# Patient Record
Sex: Female | Born: 1965 | Race: White | Hispanic: No | Marital: Married | Smoking: Current some day smoker
Health system: Southern US, Community
[De-identification: ages and names within clinical notes are randomized; demographics above are authoritative.]

## PROBLEM LIST (undated history)

## (undated) DIAGNOSIS — K603 Anal fistula: Secondary | ICD-10-CM

## (undated) DIAGNOSIS — T7840XA Allergy, unspecified, initial encounter: Secondary | ICD-10-CM

## (undated) DIAGNOSIS — K5090 Crohn's disease, unspecified, without complications: Secondary | ICD-10-CM

## (undated) DIAGNOSIS — K219 Gastro-esophageal reflux disease without esophagitis: Secondary | ICD-10-CM

## (undated) DIAGNOSIS — I471 Supraventricular tachycardia, unspecified: Secondary | ICD-10-CM

## (undated) DIAGNOSIS — F331 Major depressive disorder, recurrent, moderate: Secondary | ICD-10-CM

## (undated) DIAGNOSIS — R079 Chest pain, unspecified: Secondary | ICD-10-CM

## (undated) DIAGNOSIS — G4733 Obstructive sleep apnea (adult) (pediatric): Secondary | ICD-10-CM

## (undated) HISTORY — DX: Obstructive sleep apnea (adult) (pediatric): G47.33

## (undated) HISTORY — DX: Chest pain, unspecified: R07.9

## (undated) HISTORY — DX: Supraventricular tachycardia: I47.1

## (undated) HISTORY — PX: OTHER SURGICAL HISTORY: SHX169

## (undated) HISTORY — DX: Gastro-esophageal reflux disease without esophagitis: K21.9

## (undated) HISTORY — DX: Major depressive disorder, recurrent, moderate: F33.1

## (undated) HISTORY — DX: Supraventricular tachycardia, unspecified: I47.10

## (undated) HISTORY — DX: Crohn's disease, unspecified, without complications: K50.90

## (undated) HISTORY — DX: Allergy, unspecified, initial encounter: T78.40XA

## (undated) HISTORY — PX: ABCESS DRAINAGE: SHX399

## (undated) HISTORY — DX: Anal fistula: K60.3

---

## 2007-02-20 LAB — HM COLONOSCOPY

## 2008-07-12 ENCOUNTER — Inpatient Hospital Stay (HOSPITAL_COMMUNITY): Admission: AD | Admit: 2008-07-12 | Discharge: 2008-07-12 | Payer: Self-pay | Admitting: Obstetrics & Gynecology

## 2010-07-31 ENCOUNTER — Other Ambulatory Visit (HOSPITAL_COMMUNITY): Payer: Self-pay | Admitting: Family Medicine

## 2010-07-31 DIAGNOSIS — Z1231 Encounter for screening mammogram for malignant neoplasm of breast: Secondary | ICD-10-CM

## 2010-08-08 ENCOUNTER — Ambulatory Visit (HOSPITAL_COMMUNITY)
Admission: RE | Admit: 2010-08-08 | Discharge: 2010-08-08 | Disposition: A | Discharge status: Home / Self Care | Payer: BC Managed Care – PPO | Source: Ambulatory Visit | Attending: Family Medicine | Admitting: Family Medicine

## 2010-08-08 DIAGNOSIS — Z1231 Encounter for screening mammogram for malignant neoplasm of breast: Secondary | ICD-10-CM

## 2011-05-01 ENCOUNTER — Ambulatory Visit: Payer: Self-pay

## 2011-05-01 ENCOUNTER — Ambulatory Visit (INDEPENDENT_AMBULATORY_CARE_PROVIDER_SITE_OTHER): Payer: BC Managed Care – PPO | Admitting: Family Medicine

## 2011-05-01 ENCOUNTER — Ambulatory Visit
Admission: RE | Admit: 2011-05-01 | Discharge: 2011-05-01 | Disposition: A | Discharge status: Home / Self Care | Payer: Self-pay | Source: Ambulatory Visit | Attending: Family Medicine | Admitting: Family Medicine

## 2011-05-01 VITALS — BP 122/78 | HR 81 | Temp 99.4°F | Resp 18 | Ht 64.0 in | Wt 171.0 lb

## 2011-05-01 DIAGNOSIS — K219 Gastro-esophageal reflux disease without esophagitis: Secondary | ICD-10-CM

## 2011-05-01 DIAGNOSIS — K5090 Crohn's disease, unspecified, without complications: Secondary | ICD-10-CM | POA: Insufficient documentation

## 2011-05-01 DIAGNOSIS — R059 Cough, unspecified: Secondary | ICD-10-CM

## 2011-05-01 DIAGNOSIS — R05 Cough: Secondary | ICD-10-CM

## 2011-05-01 DIAGNOSIS — R079 Chest pain, unspecified: Secondary | ICD-10-CM

## 2011-05-01 DIAGNOSIS — Z3040 Encounter for surveillance of contraceptives, unspecified: Secondary | ICD-10-CM

## 2011-05-01 HISTORY — DX: Gastro-esophageal reflux disease without esophagitis: K21.9

## 2011-05-01 LAB — POCT CBC
Granulocyte percent: 64.8 %G (ref 37–80)
HCT, POC: 42.6 % (ref 37.7–47.9)
Hemoglobin: 13.9 g/dL (ref 12.2–16.2)
Lymph, poc: 2.3 (ref 0.6–3.4)
MCH, POC: 32.2 pg — AB (ref 27–31.2)
MCHC: 32.6 g/dL (ref 31.8–35.4)
MCV: 98.6 fL — AB (ref 80–97)
MID (cbc): 0.6 (ref 0–0.9)
MPV: 8.1 fL (ref 0–99.8)
POC Granulocyte: 5.4 (ref 2–6.9)
POC LYMPH PERCENT: 28.1 %L (ref 10–50)
POC MID %: 7.1 %M (ref 0–12)
Platelet Count, POC: 292 10*3/uL (ref 142–424)
RBC: 4.32 M/uL (ref 4.04–5.48)
RDW, POC: 12.9 %
WBC: 8.3 10*3/uL (ref 4.6–10.2)

## 2011-05-01 LAB — BASIC METABOLIC PANEL
BUN: 10 mg/dL (ref 6–23)
CO2: 25 mEq/L (ref 19–32)
Calcium: 9.7 mg/dL (ref 8.4–10.5)
Chloride: 98 mEq/L (ref 96–112)
Creat: 0.67 mg/dL (ref 0.50–1.10)
Glucose, Bld: 87 mg/dL (ref 70–99)
Potassium: 4.5 mEq/L (ref 3.5–5.3)
Sodium: 131 mEq/L — ABNORMAL LOW (ref 135–145)

## 2011-05-01 LAB — POCT SEDIMENTATION RATE: POCT SED RATE: 8 mm/hr (ref 0–22)

## 2011-05-01 MED ORDER — METHYLPREDNISOLONE 4 MG PO TABS: 4.0000 mg | ORAL_TABLET | Freq: Every day | ORAL | Status: AC

## 2011-05-01 MED ORDER — IOHEXOL 350 MG/ML SOLN
100.0000 mL | Freq: Once | INTRAVENOUS | Status: AC | PRN
  Administered 2011-05-01: 100 mL via INTRAVENOUS

## 2011-05-01 MED ORDER — OXYCODONE-ACETAMINOPHEN 5-325 MG PO TABS: 1.0000 | ORAL_TABLET | Freq: Three times a day (TID) | ORAL | Status: AC | PRN

## 2011-05-01 NOTE — Patient Instructions (Signed)
Pulmonary Embolus A pulmonary (lung) embolus (PE) is a blood clot that has traveled from another place in the body to the lung. Most clots come from deep veins in the legs or pelvis. PE is a dangerous and potentially life-threatening condition that can be treated if identified. CAUSES Blood clots form in a vein for different reasons. Usually several things cause blood clots. They include:  The flow of blood slows down.   The inside of the vein is damaged in some way.   The person has a condition that makes the blood clot more easily. These conditions may include:   Older age (especially over 41 years old).   Having a history of blood clots.   Having major or lengthy surgery. Hip surgery is particularly high-risk.   Breaking a hip or leg.   Sitting or lying still for a long time.   Cancer or cancer treatment.   Having a long, thin tube (catheter) placed inside a vein during a medical procedure.   Being overweight (obese).   Pregnancy and childbirth.   Medicines with estrogen.   Smoking.   Other circulation or heart problems.  SYMPTOMS  The symptoms of a PE usually start suddenly and include:  Shortness of breath.   Coughing.   Coughing up blood or blood-tinged mucus (phlegm).   Chest pain. Pain is often worse with deep breaths.   Rapid heartbeat.  DIAGNOSIS  If a PE is suspected, your caregiver will take a medical history and carry out a physical exam. Your caregiver will check for the risk factors listed above. Tests that also may be required include:  Blood tests, including studies of the clotting properties of your blood.   Imaging tests. Ultrasound, CT, MRI, and other tests can all be used to see if you have clots in your legs or lungs. If you have a clot in your legs and have breathing or chest problems, your caregiver may conclude that you have a clot in your lungs. Further lung tests may not be needed.   An EKG can look for heart strain from blood clots in  the lungs.  PREVENTION   Exercise the legs regularly. Take a brisk 30 minute walk every day.   Maintain a weight that is appropriate for your height.   Avoid sitting or lying in bed for long periods of time without moving your legs.   Women, particularly those over the age of 65, should consider the risks and benefits of taking estrogen medicines, including birth control pills.   Do not smoke, especially if you take estrogen medicines.   Long-distance travel can increase your risk. You should exercise your legs by walking or pumping the muscles every hour.   In hospital prevention:   Your caregiver will assess your need for preventive PE care (prophylaxis) when you are admitted to the hospital. If you are having surgery, your surgeon will assess you the day of or day after surgery.   Prevention may include medical and nonmedical measures.  TREATMENT   The most common treatment for a PE is blood thinning (anticoagulant) medicine, which reduces the blood's tendency to clot. Anticoagulants can stop new blood clots from forming and old ones from growing. They cannot dissolve existing clots. Your body does this by itself over time. Anticoagulants can be given by mouth, by intravenous (IV) access, or by injection. Your caregiver will determine the best program for you.   Less commonly, clot-dissolving drugs (thrombolytics) are used to dissolve a PE. They  carry a high risk of bleeding, so they are used mainly in severe cases.   Very rarely, a blood clot in the leg needs to be removed surgically.   If you are unable to take anticoagulants, your caregiver may arrange for you to have a filter placed in a main vein in your belly (abdomen). This filter prevents clots from traveling to your lungs.  HOME CARE INSTRUCTIONS   Take all medicines prescribed by your caregiver. Follow the directions carefully.   You will most likely continue taking anticoagulants after you leave the hospital. Your  caregiver will advise you on the length of treatment (usually 3 to 6 months, sometimes for life).   Taking too much or too little of an anticoagulant is dangerous. While taking this type of medicine, you will need to have regular blood tests to be sure the dose is correct. The dose can change for many reasons. It is critically important that you take this medicine exactly as prescribed and that you have blood tests exactly as directed.   Many foods can interfere with anticoagulants. These include foods high in vitamin K, such as spinach, kale, broccoli, cabbage, collard and turnip greens, Brussels sprouts, peas, cauliflower, seaweed, parsley, beef and pork liver, green tea, and soybean oil. Your caregiver should discuss limits on these foods with you or you should arrange a visit with a dietician to answer your questions.   Many medicines can interfere with anticoagulants. You must tell your caregiver about any and all medicines you take. This includesall vitamins and supplements. Be especially cautious with aspirin and anti-inflammatory medicines. Ask your caregiver before taking these.   Anticoagulants can have side effects, mostly excessive bruising or bleeding. You will need to hold pressure over cuts for longer than usual. Avoid alcoholic drinks or consume only very small amounts while taking this medicine.   If you are taking an anticoagulant:   Wear a medical alert bracelet.   Notify your dentist or other caregivers before procedures.   Avoid contact sports.   Ask your caregiver how soon you can go back to normal activities. Not being active can lead to new clots. Ask for a list of what you should and should not do.   Exercise your lower leg muscles. This is important while traveling.   You may need to wear compression stockings. These are tight elastic stockings that apply pressure to the lower legs. This can help keep the blood in the legs from clotting.   If you are a smoker, you  should quit.   Learn as much as you can about pulmonary embolisms.  SEEK MEDICAL CARE IF:   You notice a rapid heartbeat.   You feel weaker or more tired than usual.   You feel faint.   You notice increased bruising.   Your symptoms are not getting better in the time expected.   You are having side effects of medicine.   You have an oral temperature above 102 F (38.9 C).   You discover other family members with blood clots. This may require further testing for inherited diseases or conditions.  SEEK IMMEDIATE MEDICAL CARE IF:   You have chest pain.   You have trouble breathing.   You have new or increased swelling or pain in one leg.   You cough up blood.   You notice blood in vomit, in a bowel movement, or in urine.   You have an oral temperature above 102 F (38.9 C), not controlled by medicine.  You may have another PE. A blood clot in the lungs is a medical emergency. Call your local emergency services (911 in [place] to get to the nearest hospital or clinic. Do not drive yourself. MAKE SURE YOU:   Understand these instructions.   Will watch your condition.   Will get help right away if you are not doing well or get worse.  Document Released: 02/03/2000 Document Revised: 01/25/2011 Document Reviewed: 08/09/2008 [employer] Patient Information 2012 [place], [place].

## 2011-05-01 NOTE — Progress Notes (Signed)
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  The onset was gradual and originally thought by patient to be costochondritis and subsequently patient was treated with pneumonia with Z pak, improved, but symptoms have come back.  Patient has had right calf pain for years off and on but none recently  O: NAD, patient appears comfortable at rest with normal color Chest: nontender, clear to auscultation.  Records from Dr. Modena Morrow reviewed.  A:  Atypical chest and back pain with high risk meds  P:  Sed rate, cbc, cmet CT chest   CT report at 4:30pm:  Negative for PE, patient does have gallstones with collapsed gall bladder, no dilatation.

## 2011-05-01 NOTE — Progress Notes (Signed)
Addended by: Robyn Haber on: 05/01/2011 05:07 PM   Modules accepted: Orders

## 2011-06-04 ENCOUNTER — Encounter: Payer: Self-pay | Admitting: *Deleted

## 2011-06-05 ENCOUNTER — Encounter: Payer: Self-pay | Admitting: Pulmonary Disease

## 2011-06-05 ENCOUNTER — Ambulatory Visit (INDEPENDENT_AMBULATORY_CARE_PROVIDER_SITE_OTHER): Payer: Self-pay | Admitting: Pulmonary Disease

## 2011-06-05 DIAGNOSIS — R079 Chest pain, unspecified: Secondary | ICD-10-CM

## 2011-06-05 HISTORY — DX: Chest pain, unspecified: R07.9

## 2011-06-05 NOTE — Progress Notes (Signed)
  Subjective:    Patient ID: Autumn Robinson, female    DOB: Nov 09, 1965, 46 y.o.   MRN: [id number]  HPI The patient is a 46 year old female who I have been asked to see for atypical chest pain.  She has a history of costochondritis in the past, but approximately 7 weeks ago began to have atypical chest pain that started after developing upper respiratory infection type symptoms.  She describes the pain across the top of her back, and on occasion in the substernal area.   She did not have any discomfort laterally.  The pain was worse with breathing, but was not aggravated by movement.  She described as a sharp ache, but really did not describe classic pleuritic pain.  The patient had a chest x-ray which apparently was suggestive of right middle lobe pneumonia, and was treated with azithromycin.  The patient felt that her pain resolved 100%, but states that she was also on meloxicam at the time.  5 days after taking the antibiotic, her discomfort returned, and it was just as severe.  She underwent a CT of her chest in March, and this showed no pulmonary embolus, no pleural or parenchymal disease.  The patient also had an EKG in March that showed no evidence for pericarditis.  She was treated with a course of prednisone for 10 days and had significant improvement in her symptoms.  Her pain has really not returned except for an occasional mild substernal discomfort.  The patient has no history of autoimmune disease, and is on Remicade for Crohn's disease.  She has also had a PPD recently that was unremarkable.    Review of Systems  Constitutional: Negative.  Negative for fever and unexpected weight change.  HENT: Negative.  Negative for ear pain, nosebleeds, congestion, sore throat, rhinorrhea, sneezing, trouble swallowing, dental problem, postnasal drip and sinus pressure.   Eyes: Negative.  Negative for redness and itching.  Respiratory: Positive for cough and shortness of breath. Negative for chest tightness  and wheezing.   Cardiovascular: Positive for chest pain. Negative for palpitations and leg swelling.  Gastrointestinal: Negative.  Negative for nausea and vomiting.  Genitourinary: Negative.  Negative for dysuria.  Musculoskeletal: Negative.  Negative for joint swelling.  Skin: Negative.  Negative for rash.  Neurological: Negative.  Negative for headaches.  Hematological: Negative.  Does not bruise/bleed easily.  Psychiatric/Behavioral: Negative.  Negative for dysphoric mood. The patient is not nervous/anxious.        Objective:   Physical Exam Constitutional:  Well developed, no acute distress  HENT:  Nares patent without discharge  Oropharynx without exudate, palate and uvula are normal  Eyes:  Perrla, eomi, no scleral icterus  Neck:  No JVD, no TMG  Cardiovascular:  Normal rate, regular rhythm, no rubs or gallops.  No murmurs        Intact distal pulses  Pulmonary :  Normal breath sounds, no stridor or respiratory distress   No rales, rhonchi, or wheezing  Abdominal:  Soft, nondistended, bowel sounds present.  No tenderness noted.   Musculoskeletal:  No lower extremity edema noted.  Lymph Nodes:  No cervical lymphadenopathy noted  Skin:  No cyanosis noted  Neurologic:  Alert, appropriate, moves all 4 extremities without obvious deficit.         Assessment & Plan:

## 2011-06-05 NOTE — Patient Instructions (Signed)
No further testing or treatment suggested unless you have a recurrence of your symptoms.  At that point, would check bloodwork for possible superimposed autoimmune disease, though I suspect is unlikely.

## 2011-06-05 NOTE — Assessment & Plan Note (Signed)
The patient has had sharp atypical chest pain that may or may not be pleuritic in nature.  The question was raised whether she may have had pneumonia, and seemed to improve with antibiotics.  She also had significant improvement with a course of prednisone after her pain recurred.  She currently has minimal discomfort, and has had a CT chest that is totally unremarkable.  It is unclear whether this may have been some type of viral process from the very beginning.  At this point, I would do nothing different, and hope that her pain resolves and stays a non-issue.  If she has significant recurrence, would consider checking bloodwork for an autoimmune process.

## 2011-07-24 ENCOUNTER — Encounter: Payer: Self-pay | Admitting: Family Medicine

## 2012-03-08 ENCOUNTER — Ambulatory Visit: Payer: BC Managed Care – PPO

## 2012-06-19 LAB — HM MAMMOGRAPHY: HM Mammogram: NORMAL (ref 0–4)

## 2012-06-23 ENCOUNTER — Other Ambulatory Visit (HOSPITAL_COMMUNITY): Payer: Self-pay | Admitting: Family Medicine

## 2012-06-23 DIAGNOSIS — Z1231 Encounter for screening mammogram for malignant neoplasm of breast: Secondary | ICD-10-CM

## 2012-06-26 ENCOUNTER — Ambulatory Visit (HOSPITAL_COMMUNITY)
Admission: RE | Admit: 2012-06-26 | Discharge: 2012-06-26 | Disposition: A | Discharge status: Home / Self Care | Payer: BC Managed Care – PPO | Source: Ambulatory Visit | Attending: Family Medicine | Admitting: Family Medicine

## 2012-06-26 DIAGNOSIS — Z1231 Encounter for screening mammogram for malignant neoplasm of breast: Secondary | ICD-10-CM | POA: Insufficient documentation

## 2012-07-22 ENCOUNTER — Ambulatory Visit (INDEPENDENT_AMBULATORY_CARE_PROVIDER_SITE_OTHER): Payer: BC Managed Care – PPO | Admitting: Internal Medicine

## 2012-07-22 ENCOUNTER — Ambulatory Visit: Payer: BC Managed Care – PPO

## 2012-07-22 VITALS — BP 118/72 | HR 84 | Temp 98.4°F | Resp 16 | Ht 62.5 in | Wt 144.0 lb

## 2012-07-22 DIAGNOSIS — M545 Low back pain, unspecified: Secondary | ICD-10-CM

## 2012-07-22 DIAGNOSIS — K50919 Crohn's disease, unspecified, with unspecified complications: Secondary | ICD-10-CM

## 2012-07-22 DIAGNOSIS — M546 Pain in thoracic spine: Secondary | ICD-10-CM

## 2012-07-22 DIAGNOSIS — K5090 Crohn's disease, unspecified, without complications: Secondary | ICD-10-CM

## 2012-07-22 MED ORDER — METHOCARBAMOL 750 MG PO TABS: 750.0000 mg | ORAL_TABLET | Freq: Four times a day (QID) | ORAL | Status: DC

## 2012-07-22 MED ORDER — IBUPROFEN 600 MG PO TABS: 600.0000 mg | ORAL_TABLET | Freq: Three times a day (TID) | ORAL | Status: DC | PRN

## 2012-07-22 NOTE — Progress Notes (Signed)
  Subjective:    Patient ID: Autumn Robinson, female    DOB: [birth year], 47 y.o.   MRN: [id number]  HPI Pt complains of mid to lower back pain which she states started at work. It has lasted a couple weeks, but states it overall has been bothering her for a couple of years. Hurts to twist, or really is painful in most positions. Never had xrays of her back. She does have crohns disease. She is on Remicaid and it is under control. She denies any urinary symptoms. She denies any numbness or tingling in the legs or toes. No trouble controlling her urine. Pain is thoracolumbar with no radiation down. Sitting long periods and pushing heavy cart cause the pain . Flexion is full but feels like unstable in back.Marland Kitchen  Hx of pleurisy in past . Has decreased immunity on remacaid. Smoker 25 yrs.   She had normal lab work, normal crp 2 mos ago. No anorexia, weight loss, fatigue. Crohns is not active.  Review of Systems     Objective:   Physical Exam  Vitals reviewed. Constitutional: She is oriented to person, place, and time. She appears well-developed and well-nourished.  Eyes: EOM are normal. No scleral icterus.  Neck: Normal range of motion. Neck supple.  Cardiovascular: Normal rate, regular rhythm and normal heart sounds.   Pulmonary/Chest: Effort normal.  Musculoskeletal: Normal range of motion. She exhibits tenderness.       Thoracic back: She exhibits tenderness, pain and spasm. She exhibits normal range of motion, no bony tenderness, no swelling, no edema and no deformity.       Lumbar back: She exhibits tenderness and pain. She exhibits normal range of motion, no bony tenderness, no swelling, no edema and no deformity.  Neurological: She is alert and oriented to person, place, and time. She exhibits normal muscle tone. Coordination normal.  Skin: Skin is warm and dry. No rash noted.  Psychiatric: She has a normal mood and affect. Her behavior is normal.      [facility] reading (PRIMARY) by  Dr Elder Cyphers  normal T and L spine      Assessment & Plan:  Quit smoking Chronic thoraco lumbar pain Strengthen/Stretch/Rehab Robaxin/Motrin

## 2012-07-22 NOTE — Progress Notes (Signed)
  Subjective:    Patient ID: Autumn Robinson, female    DOB: 12-12-1965, 47 y.o.   MRN: [id number]  HPI    Review of Systems     Objective:   Physical Exam       Assessment & Plan:

## 2012-07-22 NOTE — Patient Instructions (Signed)
Mid-Back Strain with Rehab  A strain is an injury in which a tendon or muscle is torn. The muscles and tendons of the mid-back are vulnverable to strains. However, these muscles and tendons are very strong and require a great force to be injured. The muscles of the mid-back are responsible for stabilizing the spinal column, as well as spinal twisting (rotation). Strains are classified into three categories. Grade 1 strains cause pain, but the tendon is not lengthened. Grade 2 strains include a lengthened ligament, due to the ligament being stretched or partially ruptured. With grade 2 strains there is still function, although the function may be decreased. Grade 3 strains involve a complete tear of the tendon or muscle, and function is usually impaired. SYMPTOMS   Pain in the middle of the back.  Pain that may affect only one side, and is worse with movement.  Muscle spasms, and often swelling in the back.  Loss of strength of the back muscles.  Crackling sound (crepitation) when the muscles are touched. CAUSES  Mid-back strains occur when a force is placed on the muscles or tendons that is greater than they can handle. Common causes of injury include:  Ongoing overuse of the muscle-tendon units in the middle back, usually from incorrect body posture.  A single violent injury or force applied to the back. RISK INCREASES WITH:  Sports that involve twisting forces on the spine or a lot of bending at the waist (football, rugby, weightlifting, bowling, golf, tennis, speed skating, racquetball, swimming, running, gymnastics, diving).  Poor strength and flexibility.  Failure to warm up properly before activity.  Family history of low back pain or disk disorders.  Previous back injury or surgery (especially fusion). PREVENTION  Learn and use proper sports technique.  Warm up and stretch properly before activity.  Allow for adequate recovery between workouts.  Maintain physical  fitness:  Strength, flexibility, and endurance.  Cardiovascular fitness. PROGNOSIS  If treated properly, mid-back strains usually heal within 6 weeks. RELATED COMPLICATIONS   Frequently recurring symptoms, resulting in a chronic problem. Properly treating the problem the first time decreases frequency of recurrence.  Chronic inflammation, scarring, and partial muscle-tendon tear.  Delayed healing or resolution of symptoms, especially if activity is resumed too soon.  Prolonged disability. TREATMENT Treatment first involves the use of ice and medicine, to reduce pain and inflammation. As the pain begins to subside, you may begin strengthening and stretching exercises to improve body posture and sport technique. These exercises may be performed at home or with a therapist. Severe injuries may require referral to a therapist for further evaluation and treatment, such as ultrasound. Corticosteroid injections may be given to help reduce inflammation. Biofeedback (watching monitors of your body processes) and psychotherapy may also be prescribed. Prolonged bed rest is felt to do more harm than good. Massage may help break the muscle spasms. Sometimes, an injection of cortisone, with or without local anesthetics, may be given to help relieve the pain and spasms. MEDICATION   If pain medicine is needed, nonsteroidal anti-inflammatory medicines (aspirin and ibuprofen), or other minor pain relievers (acetaminophen), are often advised.  Do not take pain medicine for 7 days before surgery.  Prescription pain relievers may be given, if your caregiver thinks they are needed. Use only as directed and only as much as you need.  Ointments applied to the skin may be helpful.  Corticosteroid injections may be given by your caregiver. These injections should be reserved for the most serious cases, because  they may only be given a certain number of times. HEAT AND COLD:   Cold treatment (icing) should be  applied for 10 to 15 minutes every 2 to 3 hours for inflammation and pain, and immediately after activity that aggravates your symptoms. Use ice packs or an ice massage.  Heat treatment may be used before performing stretching and strengthening activities prescribed by your caregiver, physical therapist, or athletic trainer. Use a heat pack or a warm water soak. SEEK IMMEDIATE MEDICAL CARE IF:  Symptoms get worse or do not improve in 2 to 4 weeks, despite treatment.  You develop numbness, weakness, or loss of bowel or bladder function.  New, unexplained symptoms develop. (Drugs used in treatment may produce side effects.) EXERCISES RANGE OF MOTION (ROM) AND STRETCHING EXERCISES - Mid-Back Strain These exercises may help you when beginning to rehabilitate your injury. In order to successfully resolve your symptoms, you must improve your posture. These exercises are designed to help reduce the forward-head and rounded-shoulder posture which contributes to this condition. Your symptoms may resolve with or without further involvement from your physician, physical therapist or athletic trainer. While completing these exercises, remember:   Restoring tissue flexibility helps normal motion to return to the joints. This allows healthier, less painful movement and activity.  An effective stretch should be held for at least 30 seconds.  A stretch should never be painful. You should only feel a gentle lengthening or release in the stretched tissue. STRETECH - Axial Extension  Stand or sit on a firm surface. Assume a good posture: chest up, shoulders drawn back, stomach muscles slightly tense, knees unlocked (if standing) and feet hip width apart.  Slowly retract your chin, so your head slides back and your chin slightly lowers. Continue to look straight ahead.  You should feel a gentle stretch in the back of your head. Be certain not to feel an aggressive stretch since this can cause headaches  later.  Hold for __________ seconds. Repeat __________ times. Complete this exercise __________ times per day. RANGE OF MOTION- Upper Thoracic Extension  Sit on a firm chair with a high back. Assume a good posture: chest up, shoulders drawn back, abdominal muscles slightly tense, and feet hip width apart. Place a small pillow or folded towel in the curve of your lower back, if you are having difficulty maintaining good posture.  Gently brace your neck with your hands, allowing your arms to rest on your chest.  Continue to support your neck and slowly extend your back over the chair. You will feel a stretch across your upper back.  Hold __________ seconds. Slowly return to the starting position. Repeat __________ times. Complete this exercise __________ times per day. RANGE OF MOTION- Mid-Thoracic Extension  Roll a towel so that it is about 4 inches in diameter.  Position the towel lengthwise. Lay on the towel so that your spine, but not your shoulder blades, are supported.  You should feel your mid-back arching toward the floor. To increase the stretch, extend your arms away from your body.  Hold for __________ seconds. Repeat exercise __________ times, __________ times per day. STRENGTHENING EXERCISES - Mid-Back Strain These exercises may help you when beginning to rehabilitate your injury. They may resolve your symptoms with or without further involvement from your physician, physical therapist or athletic trainer. While completing these exercises, remember:   Muscles can gain both the endurance and the strength needed for everyday activities through controlled exercises.  Complete these exercises as instructed by  your physician, physical therapist or athletic trainer. Increase the resistance and repetitions only as guided by your caregiver.  You may experience muscle soreness or fatigue, but the pain or discomfort you are trying to eliminate should never worsen during these  exercises. If this pain does worsen, stop and make certain you are following the directions exactly. If the pain is still present after adjustments, discontinue the exercise until you can discuss the trouble with your caregiver. STRENGTHENING Quadruped, Opposite UE/LE Lift  Assume a hands and knees position on a firm surface. Keep your hands under your shoulders and your knees under your hips. You may place padding under your knees for comfort.  Find your neutral spine and gently tense your abdominal muscles so that you can maintain this position. Your shoulders and hips should form a rectangle that is parallel with the floor and is not twisted.  Keeping your trunk steady, lift your right hand no higher than your shoulder and then your left leg no higher than your hip. Make sure you are not holding your breath. Hold this position __________ seconds.  Continuing to keep your abdominal muscles tense and your back steady, slowly return to your starting position. Repeat with the opposite arm and leg. Repeat __________ times. Complete this exercise __________ times per day.  STRENGTH - Shoulder Extensors  Secure a rubber exercise band or tubing to a fixed object (table, pole) so that it is at the height of your shoulders when you are either standing, or sitting on a firm armless chair.  With a thumbs-up grip, grasp an end of the band in each hand. Straighten your elbows and lift your hands straight in front of you at shoulder height. Step back away from the secured end of band, until it becomes tense.  Squeezing your shoulder blades together, pull your hands down to the sides of your thighs. Do not allow your hands to go behind you.  Hold for __________ seconds. Slowly ease the tension on the band, as you reverse the directions and return to the starting position. Repeat __________ times. Complete this exercise __________ times per day.  STRENGTH - Horizontal Abductors Choose one of the two  positions to complete this exercise. Prone: lying on stomach:  Lie on your stomach on a firm surface so that your right / left arm overhangs the edge. Rest your forehead on your opposite forearm. With your palm facing the floor and your elbow straight, hold a __________ weight in your hand.  Squeeze your right / left shoulder blade to your mid-back spine and then slowly raise your arm to the height of the bed.  Hold for __________ seconds. Slowly reverse the directions and return to the starting position, controlling the weight as you lower your arm. Repeat __________ times. Complete this exercise __________ times per day. Standing:   Secure a rubber exercise band or tubing, so that it is at the height of your shoulders when you are either standing, or sitting on a firm armless chair.  Grasp an end of the band in each hand and have your palms face each other. Straighten your elbows and lift your hands straight in front of you at shoulder height. Step back away from the secured end of band, until it becomes tense.  Squeeze your shoulder blades together. Keeping your elbows locked and your hands at shoulder height, spread your arms apart, forming a "T" shape with your body. Hold __________ seconds. Slowly ease the tension on the band, as you  reverse the directions and return to the starting position. Repeat __________ times. Complete this exercise __________ times per day. STRENGTH - Scapular Retractors and External Rotators, Rowing  Secure a rubber exercise band or tubing, so that it is at the height of your shoulders when you are either standing, or sitting on a firm armless chair.  With a palm-down grip, grasp an end of the band in each hand. Straighten your elbows and lift your hands straight in front of you at shoulder height. Step back away from the secured end of band, until it becomes tense.  Step 1: Squeeze your shoulder blades together. Bending your elbows, draw your hands to your  chest as if you are rowing a boat. At the end of this motion, your hands and elbow should be at shoulder height and your elbows should be out to your sides.  Step 2: Rotate your shoulder to raise your hands above your head. Your forearms should be vertical and your upper arms should be horizontal.  Hold for __________ seconds. Slowly ease the tension on the band, as you reverse the directions and return to the starting position. Repeat __________ times. Complete this exercise __________ times per day.  POSTURE AND BODY MECHANICS CONSIDERATIONS  Mid-Back Strain Keeping correct posture when sitting, standing or completing your activities will reduce the stress put on different body tissues, allowing injured tissues a chance to heal and limiting painful experiences. The following are general guidelines for improved posture. Your physician or physical therapist will provide you with any instructions specific to your needs. While reading these guidelines, remember:  The exercises prescribed by your provider will help you have the flexibility and strength to maintain correct postures.  The correct posture provides the best environment for your joints to work. All of your joints have less wear and tear when properly supported by a spine with good posture. This means you will experience a healthier, less painful body.  Correct posture must be practiced with all of your activities, especially prolonged sitting and standing. Correct posture is as important when doing repetitive low-stress activities (typing) as it is when doing a single heavy-load activity (lifting). PROPER SITTING POSTURE In order to minimize stress and discomfort on your spine, you must sit with correct posture. Sitting with good posture should be effortless for a healthy body. Returning to good posture is a gradual process. Many people can work toward this most comfortably by using various supports until they have the flexibility and  strength to maintain this posture on their own. When sitting with proper posture, your ears will fall over your shoulders and your shoulders will fall over your hips. You should use the back of the chair to support your upper back. Your lower back will be in a neutral position, just slightly arched. You may place a small pillow or folded towel at the base of your low back for  support.  When working at a desk, create an environment that supports good, upright posture. Without extra support, muscles fatigue and lead to excessive strain on joints and other tissues. Keep these recommendations in mind: CHAIR:  A chair should be able to slide under your desk when your back makes contact with the back of the chair. This allows you to work closely.  The chair's height should allow your eyes to be level with the upper part of your monitor and your hands to be slightly lower than your elbows. BODY POSITION  Your feet should make contact with the  floor. If this is not possible, use a foot rest.  Keep your ears over your shoulders. This will reduce stress on your neck and lower back. INCORRECT SITTING POSTURES If you are feeling tired and unable to assume a healthy sitting posture, do not slouch or slump. This puts excessive strain on your back tissues, causing more damage and pain. Healthier options include:  Using more support, like a lumbar pillow.  Switching tasks to something that requires you to be upright or walking.  Talking a brief walk.  Lying down to rest in a neutral-spine position. CORRECT STANDING POSTURES Proper standing posture should be assumed with all daily activities, even if they only take a few moments, like when brushing your teeth. As in sitting, your ears should fall over your shoulders and your shoulders should fall over your hips. You should keep a slight tension in your abdominal muscles to brace your spine. Your tailbone should point down to the ground, not behind your  body, resulting in an over-extended swayback posture.  INCORRECT STANDING POSTURES Common incorrect standing postures include a forward head, locked knees, and an excessive swayback. WALKING Walk with an upright posture. Your ears, shoulders and hips should all line-up. CORRECT LIFTING TECHNIQUES DO :   Assume a wide stance. This will provide you more stability and the opportunity to get as close as possible to the object which you are lifting.  Tense your abdominals to brace your spine. Bend at the knees and hips. Keeping your back locked in a neutral-spine position, lift using your leg muscles. Lift with your legs, keeping your back straight.  Test the weight of unknown objects before attempting to lift them.  Try to keep your elbows locked down at your sides in order get the best strength from your shoulders when carrying an object.  Always ask for help when lifting heavy or awkward objects. INCORRECT LIFTING TECHNIQUES DO NOT:   Lock your knees when lifting, even if it is a small object.  Bend and twist. Pivot at your feet or move your feet when needing to change directions.  Assume that you can safely pick up even a paperclip without proper posture. Document Released: 02/05/2005 Document Revised: 04/30/2011 Document Reviewed: 05/20/2008 [facility] Patient Information 2014 [place], [place]. Back Exercises These exercises may help you when beginning to rehabilitate your injury. Your symptoms may resolve with or without further involvement from your physician, physical therapist or athletic trainer. While completing these exercises, remember:   Restoring tissue flexibility helps normal motion to return to the joints. This allows healthier, less painful movement and activity.  An effective stretch should be held for at least 30 seconds.  A stretch should never be painful. You should only feel a gentle lengthening or release in the stretched tissue. STRETCH  Extension, Prone on Elbows    Lie on your stomach on the floor, a bed will be too soft. Place your palms about shoulder width apart and at the height of your head.  Place your elbows under your shoulders. If this is too painful, stack pillows under your chest.  Allow your body to relax so that your hips drop lower and make contact more completely with the floor.  Hold this position for __________ seconds.  Slowly return to lying flat on the floor. Repeat __________ times. Complete this exercise __________ times per day.  RANGE OF MOTION  Extension, Prone Press Ups   Lie on your stomach on the floor, a bed will be too soft. Place  your palms about shoulder width apart and at the height of your head.  Keeping your back as relaxed as possible, slowly straighten your elbows while keeping your hips on the floor. You may adjust the placement of your hands to maximize your comfort. As you gain motion, your hands will come more underneath your shoulders.  Hold this position __________ seconds.  Slowly return to lying flat on the floor. Repeat __________ times. Complete this exercise __________ times per day.  RANGE OF MOTION- Quadruped, Neutral Spine   Assume a hands and knees position on a firm surface. Keep your hands under your shoulders and your knees under your hips. You may place padding under your knees for comfort.  Drop your head and point your tail bone toward the ground below you. This will round out your low back like an angry cat. Hold this position for __________ seconds.  Slowly lift your head and release your tail bone so that your back sags into a large arch, like an old horse.  Hold this position for __________ seconds.  Repeat this until you feel limber in your low back.  Now, find your "sweet spot." This will be the most comfortable position somewhere between the two previous positions. This is your neutral spine. Once you have found this position, tense your stomach muscles to support your low  back.  Hold this position for __________ seconds. Repeat __________ times. Complete this exercise __________ times per day.  STRETCH  Flexion, Single Knee to Chest   Lie on a firm bed or floor with both legs extended in front of you.  Keeping one leg in contact with the floor, bring your opposite knee to your chest. Hold your leg in place by either grabbing behind your thigh or at your knee.  Pull until you feel a gentle stretch in your low back. Hold __________ seconds.  Slowly release your grasp and repeat the exercise with the opposite side. Repeat __________ times. Complete this exercise __________ times per day.  STRETCH - Hamstrings, Standing  Stand or sit and extend your right / left leg, placing your foot on a chair or foot stool  Keeping a slight arch in your low back and your hips straight forward.  Lead with your chest and lean forward at the waist until you feel a gentle stretch in the back of your right / left knee or thigh. (When done correctly, this exercise requires leaning only a small distance.)  Hold this position for __________ seconds. Repeat __________ times. Complete this stretch __________ times per day. STRENGTHENING  Deep Abdominals, Pelvic Tilt   Lie on a firm bed or floor. Keeping your legs in front of you, bend your knees so they are both pointed toward the ceiling and your feet are flat on the floor.  Tense your lower abdominal muscles to press your low back into the floor. This motion will rotate your pelvis so that your tail bone is scooping upwards rather than pointing at your feet or into the floor.  With a gentle tension and even breathing, hold this position for __________ seconds. Repeat __________ times. Complete this exercise __________ times per day.  STRENGTHENING  Abdominals, Crunches   Lie on a firm bed or floor. Keeping your legs in front of you, bend your knees so they are both pointed toward the ceiling and your feet are flat on the  floor. Cross your arms over your chest.  Slightly tip your chin down without bending your neck.  Tense your abdominals and slowly lift your trunk high enough to just clear your shoulder blades. Lifting higher can put excessive stress on the low back and does not further strengthen your abdominal muscles.  Control your return to the starting position. Repeat __________ times. Complete this exercise __________ times per day.  STRENGTHENING  Quadruped, Opposite UE/LE Lift   Assume a hands and knees position on a firm surface. Keep your hands under your shoulders and your knees under your hips. You may place padding under your knees for comfort.  Find your neutral spine and gently tense your abdominal muscles so that you can maintain this position. Your shoulders and hips should form a rectangle that is parallel with the floor and is not twisted.  Keeping your trunk steady, lift your right hand no higher than your shoulder and then your left leg no higher than your hip. Make sure you are not holding your breath. Hold this position __________ seconds.  Continuing to keep your abdominal muscles tense and your back steady, slowly return to your starting position. Repeat with the opposite arm and leg. Repeat __________ times. Complete this exercise __________ times per day. Document Released: 02/23/2005 Document Revised: 04/30/2011 Document Reviewed: 05/20/2008 [facility] Patient Information 2014 [place], [place]. Back Pain, Adult Low back pain is very common. About 1 in 5 people have back pain.The cause of low back pain is rarely dangerous. The pain often gets better over time.About half of people with a sudden onset of back pain feel better in just 2 weeks. About 8 in 10 people feel better by 6 weeks.  CAUSES Some common causes of back pain include:  Strain of the muscles or ligaments supporting the spine.  Wear and tear (degeneration) of the spinal discs.  Arthritis.  Direct injury to the  back. DIAGNOSIS Most of the time, the direct cause of low back pain is not known.However, back pain can be treated effectively even when the exact cause of the pain is unknown.Answering your caregiver's questions about your overall health and symptoms is one of the most accurate ways to make sure the cause of your pain is not dangerous. If your caregiver needs more information, he or she may order lab work or imaging tests (X-rays or MRIs).However, even if imaging tests show changes in your back, this usually does not require surgery. HOME CARE INSTRUCTIONS For many people, back pain returns.Since low back pain is rarely dangerous, it is often a condition that people can learn to [facility] their own.   Remain active. It is stressful on the back to sit or stand in one place. Do not sit, drive, or stand in one place for more than 30 minutes at a time. Take short walks on level surfaces as soon as pain allows.Try to increase the length of time you walk each day.  Do not stay in bed.Resting more than 1 or 2 days can delay your recovery.  Do not avoid exercise or work.Your body is made to move.It is not dangerous to be active, even though your back may hurt.Your back will likely heal faster if you return to being active before your pain is gone.  Pay attention to your body when you bend and lift. Many people have less discomfortwhen lifting if they bend their knees, keep the load close to their bodies,and avoid twisting. Often, the most comfortable positions are those that put less stress on your recovering back.  Find a comfortable position to sleep. Use a firm mattress and lie  on your side with your knees slightly bent. If you lie on your back, put a pillow under your knees.  Only take over-the-counter or prescription medicines as directed by your caregiver. Over-the-counter medicines to reduce pain and inflammation are often the most helpful.Your caregiver may prescribe muscle relaxant  drugs.These medicines help dull your pain so you can more quickly return to your normal activities and healthy exercise.  Put ice on the injured area.  Put ice in a plastic bag.  Place a towel between your skin and the bag.  Leave the ice on for 15-20 minutes, 3-4 times a day for the first 2 to 3 days. After that, ice and heat may be alternated to reduce pain and spasms.  Ask your caregiver about trying back exercises and gentle massage. This may be of some benefit.  Avoid feeling anxious or stressed.Stress increases muscle tension and can worsen back pain.It is important to recognize when you are anxious or stressed and learn ways to manage it.Exercise is a great option. SEEK MEDICAL CARE IF:  You have pain that is not relieved with rest or medicine.  You have pain that does not improve in 1 week.  You have new symptoms.  You are generally not feeling well. SEEK IMMEDIATE MEDICAL CARE IF:   You have pain that radiates from your back into your legs.  You develop new bowel or bladder control problems.  You have unusual weakness or numbness in your arms or legs.  You develop nausea or vomiting.  You develop abdominal pain.  You feel faint. Document Released: 02/05/2005 Document Revised: 08/07/2011 Document Reviewed: 06/26/2010 [facility] Patient Information 2014 [place], [place].

## 2012-11-19 HISTORY — PX: TRANSTHORACIC ECHOCARDIOGRAM: SHX275

## 2012-12-17 DIAGNOSIS — E785 Hyperlipidemia, unspecified: Secondary | ICD-10-CM

## 2012-12-17 DIAGNOSIS — K603 Anal fistula, unspecified: Secondary | ICD-10-CM

## 2012-12-17 DIAGNOSIS — F331 Major depressive disorder, recurrent, moderate: Secondary | ICD-10-CM

## 2012-12-17 DIAGNOSIS — M064 Inflammatory polyarthropathy: Secondary | ICD-10-CM | POA: Insufficient documentation

## 2012-12-17 HISTORY — DX: Hyperlipidemia, unspecified: E78.5

## 2012-12-17 HISTORY — DX: Anal fistula: K60.3

## 2012-12-17 HISTORY — DX: Anal fistula, unspecified: K60.30

## 2012-12-17 HISTORY — DX: Major depressive disorder, recurrent, moderate: F33.1

## 2013-06-29 ENCOUNTER — Ambulatory Visit (INDEPENDENT_AMBULATORY_CARE_PROVIDER_SITE_OTHER): Payer: BC Managed Care – PPO | Admitting: Nurse Practitioner

## 2013-06-29 ENCOUNTER — Encounter: Payer: Self-pay | Admitting: Nurse Practitioner

## 2013-06-29 VITALS — BP 108/60 | HR 88 | Ht 62.25 in | Wt 164.0 lb

## 2013-06-29 DIAGNOSIS — Z0000 Encounter for general adult medical examination without abnormal findings: Secondary | ICD-10-CM

## 2013-06-29 DIAGNOSIS — Z975 Presence of (intrauterine) contraceptive device: Secondary | ICD-10-CM

## 2013-06-29 DIAGNOSIS — Z01419 Encounter for gynecological examination (general) (routine) without abnormal findings: Secondary | ICD-10-CM

## 2013-06-29 LAB — POCT URINALYSIS DIPSTICK
Bilirubin, UA: NEGATIVE
Glucose, UA: NEGATIVE
KETONES UA: NEGATIVE
LEUKOCYTES UA: NEGATIVE
Nitrite, UA: NEGATIVE
PROTEIN UA: NEGATIVE
Urobilinogen, UA: NEGATIVE
pH, UA: 6.5

## 2013-06-29 LAB — HEMOGLOBIN, FINGERSTICK: HEMOGLOBIN, FINGERSTICK: 14.2 g/dL (ref 12.0–16.0)

## 2013-06-29 NOTE — Patient Instructions (Signed)

## 2013-06-29 NOTE — Progress Notes (Signed)
Patient ID: Autumn Robinson, female   DOB: 1965-09-24, 48 y.o.   MRN: [id number] 48 y.o. [id number] Married Caucasian Fe here for NGYN annual exam.    Menses regular for 27 -29 days apart.  Flow for 2-3 days, some increase cramps, bloating and PMS.  Normally using condoms for birth control.  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  She has decided now that she would like Paragard IUD.  The condoms seem to cause her an irritation.  Patient's last menstrual period was 06/26/2013.          Sexually active: yes  The current method of family planning is condoms all of the time, for about 4 years   Exercising: no  The patient does not participate in regular exercise at present. Smoker:  yes  Health Maintenance: Pap:  About one year ago at PCP, Dr. Modena Morrow also had HR HPV done and was negative. No history of abnormal. MMG:  06/27/12, Bi-Rads 1: negative  Colonoscopy:  2008, repeat at 49 yo if Crohn's doing well, sooner if needed TDaP:  ? UTD Labs: HB:  14.2  Urine:  Trace RBC end of menses   reports that she has been smoking Cigarettes.  She has a 25 pack-year smoking history. She has never used smokeless tobacco. She reports that she does not drink alcohol or use illicit drugs.  Past Medical History  Diagnosis Date  . Crohn's disease   . Heart palpitations     cardioin WS- supra ventricular tach  . Allergy     seasonal    Past Surgical History  Procedure Laterality Date  . Abcess drainage  1997, 1998, and again 8 years later    multiple perianal abcesses, several surgeries    Current Outpatient Prescriptions  Medication Sig Dispense Refill  . acetaminophen-codeine (TYLENOL #4) 300-60 MG per tablet Take 1 tablet by mouth every 4 (four) hours as needed.      . inFLIXimab (REMICADE) 100 MG injection Inject into the vein.      Marland Kitchen LORazepam (ATIVAN) 1 MG tablet 1 by mouth every 6 weeks before Remicaide injection      . Magnesium 200 MG TABS Take 1  tablet by mouth daily.      . Melatonin 3 MG TABS Take 1 tablet by mouth at bedtime.      Marland Kitchen POTASSIUM CHLORIDE PO Take 2 tablets by mouth daily.       No current facility-administered medications for this visit.    Family History  Problem Relation Age of Onset  . Hypertension    . Diabetes    . Coronary artery disease    . Arthritis    . Osteoporosis    . Hyperlipidemia    . Allergies Mother   . Hypertension Mother   . GER disease Mother   . Allergies Father   . Hypertension Father   . Diabetes Father   . Esophageal cancer Maternal Grandmother   . Cancer Maternal Grandmother   . Macular degeneration Maternal Grandmother   . Bone cancer Maternal Grandfather   . Cancer Maternal Grandfather   . Crohn's disease Sister   . Heart disease Paternal Grandfather   . Stroke Paternal Grandfather     ROS:  Pertinent items are noted in HPI.  Otherwise, a comprehensive ROS was negative.  Exam:   BP 108/60  Pulse 88  Ht 5' 2.25" (1.581 m)  Wt 164 lb (  74.39 kg)  BMI 29.76 kg/m2  LMP 06/26/2013 Height: 5' 2.25" (158.1 cm)  Ht Readings from Last 3 Encounters:  06/29/13 5' 2.25" (1.581 m)  07/22/12 5' 2.5" (1.588 m)  06/05/11 5' 2"  (1.575 m)    General appearance: alert, cooperative and appears stated age Head: Normocephalic, without obvious abnormality, atraumatic Neck: no adenopathy, supple, symmetrical, trachea midline and thyroid normal to inspection and palpation Lungs: clear to auscultation bilaterally Breasts: normal appearance, no masses or tenderness Heart: regular rate and rhythm Abdomen: soft, non-tender; no masses,  no organomegaly Extremities: extremities normal, atraumatic, no cyanosis or edema Skin: Skin color, texture, turgor normal. No rashes or lesions Lymph nodes: Cervical, supraclavicular, and axillary nodes normal. No abnormal inguinal nodes palpated Neurologic: Grossly normal   Pelvic: External genitalia:  no lesions              Urethra:  normal  appearing urethra with no masses, tenderness or lesions              Bartholin's and Skene's: normal                 Vagina: normal appearing vagina with normal color and discharge, no lesions              Cervix: anteverted              Pap taken: no Bimanual Exam:  Uterus:  normal size, contour, position, consistency, mobility, non-tender              Adnexa: no mass, fullness, tenderness               Rectovaginal: not done - has an anal fistula and has incontinence               Anus:  deferred  A:  Well Woman with normal exam  Desires ParaGard IUD for contraception  P:   Reviewed health and wellness pertinent to exam  Pap smear not taken today  Mammogram is now   Order is placed for IUD and will follow with insertion at next menses.  Information is given to pt as well.  Counseled on breast self exam, mammography screening, adequate intake of calcium and vitamin D, diet and exercise return annually or prn  An After Visit Summary was printed and given to the patient.

## 2013-07-01 NOTE — Progress Notes (Signed)
Encounter reviewed by Dr. Lycan Davee Silva.  

## 2013-07-03 ENCOUNTER — Telehealth: Payer: Self-pay | Admitting: Nurse Practitioner

## 2013-07-03 NOTE — Telephone Encounter (Signed)
Spoke with patient. Advised that per benefits quote received, IUD and insertion is covered at 100%. There will be 0 patient liability. Patient is to call within the first 5 days of her cycle to schedule insertion. °

## 2013-07-24 MED ORDER — MISOPROSTOL 200 MCG PO TABS: ORAL_TABLET | ORAL | Status: DC

## 2013-07-24 NOTE — Telephone Encounter (Signed)
Spoke with patient. Is is a patient of Mardene Celeste Rolen-Grubb, FNP. IUD (Paragard is precerted). IUD scheduled with Dr. Charlies Constable for 07/27/13 at 1100. Menses started 07/23/13. Patient is G2P0. Cytotec ordered to pharmacy of choice.  Pre procedure instructions given.  Motrin instructions given. Motrin=Advil=Ibuprofen, 800 mg one hour before appointment. Eat a meal and hydrate well before appointment.  Take Cytotec 200 mcg tablet. 1 tablet PO night before the procedure. 1 tablet PO the morning of the procedure.  Patient agreeable and verbalized understanding of instructions.   Routing to provider for final review. Patient agreeable to disposition. Will close encounter  Called to [facility] on [address] and changed order of cytotec from pv to po. Patient aware.

## 2013-07-24 NOTE — Telephone Encounter (Signed)
Patient calling to schedule IUD. She started her menstrual cycle last night but hopes to come in this coming Monday, 06/26/13, for an appointment.

## 2013-07-27 ENCOUNTER — Encounter: Payer: Self-pay | Admitting: Gynecology

## 2013-07-27 ENCOUNTER — Ambulatory Visit (INDEPENDENT_AMBULATORY_CARE_PROVIDER_SITE_OTHER): Payer: BC Managed Care – PPO | Admitting: Gynecology

## 2013-07-27 VITALS — BP 116/64 | Resp 16 | Ht 62.25 in | Wt 164.0 lb

## 2013-07-27 DIAGNOSIS — Z30430 Encounter for insertion of intrauterine contraceptive device: Secondary | ICD-10-CM

## 2013-07-27 DIAGNOSIS — Z3009 Encounter for other general counseling and advice on contraception: Secondary | ICD-10-CM

## 2013-07-27 NOTE — Patient Instructions (Addendum)
IUD PLACEMENT POST PROCEDURE INSTRUCTIONS  1. You may take Ibuprofen, Aleve or Tylenol for pain as needed.      Cramping should resolve within 24 hours.  2. You may have a small amount of spotting. You should wear a mini pad for the next few days  3. You may have intercourse after 24 hours. If you are using this for birth control, it is effective immediately.  4. You need to call if you have any pelvic pain, fever, heavy bleeding or foul smelling vaginal discharge. Irregular bleeding is common the first several months after having an IUD placed. You do not need to for this reason unless you are                     concerned.   5. Shower or bathe as normal  6. You should have a follow-up appointment in 4-8 weeks for a re-check to make sure you are not having any problems.  

## 2013-07-27 NOTE — Progress Notes (Signed)
34 yrs  Married Caucasian female presents for  insertion of ParaGard. Denies any vaginal symptoms or STD concerns.  Pt counseled regarding different IUD's available.  Discussed mirena vs paraguard.  Discussed changes in menses in perimenopausal period.  Pt opts for paraguard, concerns she may be fertile still at 52.5 and expressed concerns re hormones.  LMP:  6/415  Patient read information regarding IUD insertion.  All questions addressed.    Healthy female,time, place and personnormal menses, no abnormal bleeding, pelvic pain or discharge, no breast pain or new or enlarging lumps on self exam Abdomen: soft, non-tender Gro[place] inguinal nodes palpated  Pelvic exam: Vulva;normal female genitalia  Vagina:normal vagina, no discharge, exudate, lesion, or erythema  Cervix:Non-tender, Negative CMT, no lesions or redness, nulliparous/parous os  Uterus:normal shape, position and consistency     Procedure:  Speculum inserted into vagina. Cervix visualized and cleansed with betadine solution X 3. Tenaculum placed on cervix at 12 o'clock position(s).  Uterus sounded to 7 centimeters.  IUD removed from sterile packet and under sterile conditions inserted to fundus of uterus.  Introducer removed without difficulty.  IUD string trimmed to 3 centimeters.  Remainder string given to patient to feel for identification.  Tenaculum removed.  No bleeding noted.  Speculum removed.  Uterus palpated normal.  Patient tolerated procedure well.  A: Insertion of ParaGard, Lot # [id number], Expiration date 2025   P:  Instructions and warnings signs given.       IUD identification card given with IUD removal 07/2023       Return visit 70m

## 2013-08-03 ENCOUNTER — Encounter: Payer: Self-pay | Admitting: Cardiology

## 2013-08-03 ENCOUNTER — Ambulatory Visit: Payer: BC Managed Care – PPO | Admitting: Obstetrics & Gynecology

## 2013-08-03 ENCOUNTER — Ambulatory Visit (INDEPENDENT_AMBULATORY_CARE_PROVIDER_SITE_OTHER): Payer: BC Managed Care – PPO | Admitting: Cardiology

## 2013-08-03 VITALS — BP 118/68 | HR 88 | Ht 62.0 in | Wt 164.3 lb

## 2013-08-03 DIAGNOSIS — I471 Supraventricular tachycardia, unspecified: Secondary | ICD-10-CM

## 2013-08-03 DIAGNOSIS — E669 Obesity, unspecified: Secondary | ICD-10-CM

## 2013-08-03 MED ORDER — METOPROLOL TARTRATE 25 MG PO TABS: 25.0000 mg | ORAL_TABLET | Freq: Two times a day (BID) | ORAL | Status: DC | PRN

## 2013-08-03 NOTE — Patient Instructions (Signed)
If you have a short episode PSVT ---TRY VAGAL MANEUVERS----COUGH, DRINK COLD WATER, BARE DOWN  IF YOU YOUR EPISODES LAST LONGER-  Metoprolol take 1/2 tablet twice a day  If you have to use for than 3 days wean yourself 1/2 tablet  other day for 3 days   Continue with current medication.  Your physician wants you to follow-up in 6 months Dr Ellyn Hack. You will receive a reminder letter in the mail two months in advance. If you don't receive a letter, please call our office to schedule the follow-up appointment.

## 2013-08-06 ENCOUNTER — Encounter: Payer: Self-pay | Admitting: Obstetrics & Gynecology

## 2013-08-06 ENCOUNTER — Ambulatory Visit (INDEPENDENT_AMBULATORY_CARE_PROVIDER_SITE_OTHER): Payer: BC Managed Care – PPO | Admitting: Obstetrics & Gynecology

## 2013-08-06 ENCOUNTER — Telehealth: Payer: Self-pay | Admitting: Gynecology

## 2013-08-06 ENCOUNTER — Ambulatory Visit (INDEPENDENT_AMBULATORY_CARE_PROVIDER_SITE_OTHER): Payer: BC Managed Care – PPO

## 2013-08-06 VITALS — BP 98/60 | HR 68 | Temp 98.2°F | Resp 16 | Ht 62.25 in | Wt 166.0 lb

## 2013-08-06 DIAGNOSIS — R102 Pelvic and perineal pain: Secondary | ICD-10-CM

## 2013-08-06 DIAGNOSIS — R109 Unspecified abdominal pain: Secondary | ICD-10-CM

## 2013-08-06 DIAGNOSIS — N83209 Unspecified ovarian cyst, unspecified side: Secondary | ICD-10-CM

## 2013-08-06 DIAGNOSIS — N83201 Unspecified ovarian cyst, right side: Secondary | ICD-10-CM

## 2013-08-06 DIAGNOSIS — N949 Unspecified condition associated with female genital organs and menstrual cycle: Secondary | ICD-10-CM

## 2013-08-06 LAB — POCT URINALYSIS DIPSTICK
Bilirubin, UA: NEGATIVE
GLUCOSE UA: NEGATIVE
Ketones, UA: NEGATIVE
LEUKOCYTES UA: NEGATIVE
NITRITE UA: NEGATIVE
Protein, UA: NEGATIVE
UROBILINOGEN UA: NEGATIVE
pH, UA: 5

## 2013-08-06 NOTE — Progress Notes (Signed)
Subjective:     Patient ID: Autumn Robinson, female   DOB: [birth year], 48 y.o.   MRN: [id number]  HPI 48 yo G2P2 MWF here for IUD check.  After placement, had cramping which was significant for three days.  Took ibuprofen.  This improved and seems like it was going away.  She has had some spotting as well.  This is not heavy.  She did have intercourse over the weekend without pain.  Then for the past three days, she has experienced significant abdominal cramping.  She states this is more like the pain immediately after the IUD was inserted.  She hasn't tried to feel the strings.  Denies fever, discharge, vaginal odor.  Denies new partner.  No constipation.  No urinary changes/symptoms.    Review of Systems  All other systems reviewed and are negative.      Objective:   Physical Exam  Constitutional: She is oriented to person, place, and time. She appears well-developed and well-nourished.  Abdominal: Soft. Bowel sounds are normal. She exhibits no distension and no mass. There is no tenderness. There is no rebound and no guarding.  Genitourinary: Uterus normal. There is no rash or tenderness on the right labia. There is no rash or tenderness on the left labia. Uterus is not enlarged and not tender. Right adnexum displays tenderness. Right adnexum displays no mass. Left adnexum displays no mass and no tenderness.  2cm IUD string noted at cervical os.  Lymphadenopathy:       Left: No inguinal adenopathy present.  Neurological: She is alert and oriented to person, place, and time.  Skin: Skin is warm and dry.  Psychiatric: She has a normal mood and affect.       Assessment:     Abdominal cramping Right adnexal tenderness     Plan:     Proceed with PUS to evaluated for correct placement and possible ovarian cyst

## 2013-08-06 NOTE — Telephone Encounter (Signed)
Spoke with patient. Patient states that she had IUD placed on 6/8 and had "severe cramping" for three days after at a pain on 9/10. Cramping went away but returned this morning. Patient states the pain is a 6/10 today and she has not taken medicine yet today. Denies fevers, chills, nausea, and vaginal discharge. Advised patient that we would like patient to be seen in office for evaluation for IUD placement with a doctor. Advised Dr.Silva is off today but could get patient seen by another provider. Patient agreeable. Appointment scheduled with Dr.Miller at 12:45pm (time per Gay Filler). Patient agreeable to date and time. Patient would like to know if they are going to dilate her cervix again. Advised that [place] will check to see if she can see the IUD strings and may need ultrasound to verify placement in the uterus but that this will be up to the doctor and may not be needed. Patient is agreeable.  Routing to [place] as covering CC: Dr.Lathrop  Routing to provider for final review. Patient agreeable to disposition. Will close encounter

## 2013-08-06 NOTE — Telephone Encounter (Signed)
Patient is having severe cramping after IUD placement about 10 days ago. Patient is asking if this is normal.

## 2013-08-06 NOTE — Progress Notes (Signed)
Pt needed to leave before speaking with me after PUS.  ROI reviewed.  Detailed message left with pt informing her that her IUD is in correct location/position.  She does have a 2.7cm right hemorrhagic cyst.  No other abnormalities seen.  Appropriate pain medications recommended.  Advised if pain not completely resolved within 2 weeks, she needs to call and be seen again.  Asked pt to call back with any questions/concerns.

## 2013-08-10 ENCOUNTER — Encounter: Payer: Self-pay | Admitting: Cardiology

## 2013-08-10 DIAGNOSIS — I471 Supraventricular tachycardia, unspecified: Secondary | ICD-10-CM

## 2013-08-10 DIAGNOSIS — E669 Obesity, unspecified: Secondary | ICD-10-CM | POA: Insufficient documentation

## 2013-08-10 HISTORY — DX: Supraventricular tachycardia, unspecified: I47.10

## 2013-08-10 HISTORY — DX: Obesity, unspecified: E66.9

## 2013-08-10 NOTE — Assessment & Plan Note (Signed)
Spoke briefly about importance of dietary modification and continued exercise in order to keep the body weight down and avoid other size related issues.

## 2013-08-10 NOTE — Assessment & Plan Note (Signed)
These symptoms don't seem to be happening very frequently. Only 2-3 in the last couple months and it lasts about 30 minutes. My recommendation would be to use when necessary metoprolol 25 mg twice a day which she would take 3-4 days in a row. Basically she continued to 25 or 50 mg as it occurs and then take it routinely for about 3-4 days and then stop. Again went over some vague maneuvers including to the coughing and bearing down/Valsalva, we talked about drinking cold water which can also help. Talk about the importance of going over to Cipro she is driving or getting down on the ground if she is walking. If the episodes become more more frequent, we may consider possible referral to EP for consideration of ablation. For now would simply treat medically with when necessary metoprolol. She didn't tolerate long-term metoprolol at an elevated dose, possibly because the dose was too high for her.

## 2013-08-10 NOTE — Progress Notes (Signed)
PATIENT: Autumn Robinson MRN: [id number] DOB: Jul 13, 1965 PCP: Florina Ou, MD  Clinic Note: Chief Complaint  Patient presents with  . Annual Exam    hx PSVT receently dx 6 months , ESTABLISH WITH [place] cardiology , no chest pain in 2 months , sob, no edema, had echo  and wore monitor    HPI: Autumn Robinson is a 48 y.o. female who works at [employer] with a [place] below who presents today for no new cardiologist - she had been seeing a cardiologist (Dr. Tomie China) at [facility] cardiology. She has a diagnosis of symptomatic recurrent PSVT. She is not actually had true syncopal episodes but has had recurrent bouts of SVT. She knows what triggers aren't as include caffeine and sweets and as well as chocolate. She tries to avoid especially caffeine. She's done relatively the document is in a neutral without any medications. She went to visit this visit up when she hasn't had palpitations while the GI office to be evaluated. In the past she had been on a beta blocker which he tolerated. Metoprolol 50 twice a day made her extremely groggy. She had not been tried on a lower dose. She discussed the possibility of EP evaluation for possible catheter ablation, and they decided that was not yet solid need to be considered due to the lack of frequency and overall symptoms.  Cardiac evaluation to date included an event monitor that actually did show any intermittent PSVT. 2-D echocardiogram: Normal LV size and function, EF 60-65%. No regional wall motion abnormalities. No notable valvular lesions. No effusion.  Interval History: Autumn Robinson presents today mostly to establish a cardiologist she now as being [place] as opposed to [place]. Her diagnosis of SVT was about 6 months ago and she is done relatively well by avoiding triggers. She's had about 2 or 3 episodes in the last 3 or 4 months. These are usually when she is tired or early in the morning. She denied any syncope or near-syncope symptoms associated  with it. She's tried different vagal maneuvers such as coughing without success in breaking it. She is usually just waited the episodes out until they dissipated on their own.  The remainder of cardiac review of systems is as follows: Cardiovascular ROS: no chest pain or dyspnea on exertion positive for - irregular heartbeat, palpitations and rapid heart rate negative for - chest pain, edema, loss of consciousness, murmur, orthopnea, paroxysmal nocturnal dyspnea, shortness of breath or Syncope/near syncope, TIAs amaurosis fugax, melena, hematochezia, hematuria, epistaxis; claudication. :  Past Medical History  Diagnosis Date  . Crohn's disease   . PSVT (paroxysmal supraventricular tachycardia)     Diagnosed in [facility] by Dr. Tomie China  . Allergy     seasonal    Prior Cardiac Evaluation and Past Surgical History: Past Surgical History  Procedure Laterality Date  . Abcess drainage  1997, 1998, and again 8 years later    multiple perianal abcesses, several surgeries  . Transthoracic echocardiogram  October 2014    Normal LV size and function, EF 60-65%. No regional wall motion abnormalities. No notable valvular lesions. No effusion.  . Cardiac event monitor  October-November 2014    Demonstrated PSVT    Allergies  Allergen Reactions  . Sulfa Antibiotics     Current Outpatient Prescriptions  Medication Sig Dispense Refill  . inFLIXimab (REMICADE) 100 MG injection Inject into the vein.      Marland Kitchen LORazepam (ATIVAN) 1 MG tablet 1 by mouth every 8  weeks before Remicaide injection      . Magnesium 200 MG TABS Take 1 tablet by mouth daily.      . Melatonin 3 MG TABS Take 1 tablet by mouth at bedtime.      Marland Kitchen POTASSIUM CHLORIDE PO Take 2 tablets by mouth daily.      . metoprolol tartrate (LOPRESSOR) 25 MG tablet Take 1 tablet (25 mg total) by mouth 2 (two) times daily as needed.  30 tablet  11   No current facility-administered medications for this visit.    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 She has a graduate school agree to. Smokes about one pack a day. Rare alcohol consumption. She uses and for anxiety around her apartment for medication infusion for her Crohn's.         Family History : Allergies in her father and mother; Arthritis in an other family member; Bone cancer in her maternal grandfather; Cancer in her maternal grandfather and maternal grandmother; Coronary artery disease in an other family member; Crohn's disease in her sister; Diabetes in her father and another family member; Esophageal cancer in her maternal grandmother; GER disease in her mother; Heart disease in her paternal grandfather; Hyperlipidemia in an other family member; Hypertension in her father, mother, and another family member; Macular degeneration in her maternal grandmother; Osteoporosis in an other family member; Stroke in her paternal grandfather.  ROS: A comprehensive Review of Systems - Negative except Intermittent GI discomfort from her Crohn's. No recent exacerbations.  PHYSICAL EXAM BP 118/68  Pulse 88  Ht 5' 2"  (1.575 m)  Wt 164 lb 4.8 oz (74.526 kg)  BMI 30.04 kg/m2  LMP 07/23/2013 General appearance: alert, cooperative, appears stated age, no distress and Pleasant mood and affect. Borderline obese HEENT: [place]/AT, EOMI, MMM, anicteric sclera Neck: no adenopathy, no carotid bruit, no JVD, supple, symmetrical, trachea midline and thyroid not enlarged, symmetric, no tenderness/mass/nodules Lungs: clear to auscultation bilaterally, normal percussion bilaterally and Nonlabored, good air movement Heart: regular rate and rhythm, S1, S2 normal, no murmur, click, rub or gallop and normal apical impulse Abdomen: soft, non-tender; bowel sounds normal; no masses,  no organomegaly Extremities: extremities normal, atraumatic, no cyanosis or edema Pulses: 2+ and symmetric Skin: Skin color, texture, turgor  normal. No rashes or lesions Neurologic: Alert and oriented X 3, normal strength and tone. Normal symmetric reflexes. Normal coordination and gait   Adult ECG Report  Rate: 88 ;  Rhythm: normal sinus rhythm and Possible anterior MI, age undetermined. Likely normal variant.  Recent Labs: No recent labs  ASSESSMENT / PLAN: Very pleasant relatively young woman with a diagnosis of PSVT. Relatively controlled with avoiding triggers such as excess caffeine, chocolate or sweets.Marland Kitchen  PSVT (paroxysmal supraventricular tachycardia) These symptoms don't seem to be happening very frequently. Only 2-3 in the last couple months and it lasts about 30 minutes. My recommendation would be to use when necessary metoprolol 25 mg twice a day which she would take 3-4 days in a row. Basically she continued to 25 or 50 mg as it occurs and then take it routinely for about 3-4 days and then stop. Again went over some vague maneuvers including to the coughing and bearing down/Valsalva, we talked about drinking cold water which can also help. Talk about the importance of going over to Cipro she is driving or getting down on the ground if she is walking.  If the episodes become more more frequent, we may consider possible referral to EP for consideration of ablation. For now would simply treat medically with when necessary metoprolol. She didn't tolerate long-term metoprolol at an elevated dose, possibly because the dose was too high for her.  Obesity (BMI 30-39.9) Spoke briefly about importance of dietary modification and continued exercise in order to keep the body weight down and avoid other size related issues.    Orders Placed This Encounter  Procedures  . EKG 12-Lead   Meds ordered this encounter  Medications  . metoprolol tartrate (LOPRESSOR) 25 MG tablet    Sig: Take 1 tablet (25 mg total) by mouth 2 (two) times daily as needed.    Dispense:  30 tablet    Refill:  11    Followup: 6 months  DAVID W.  Ellyn Hack, M.D., M.S. Interventional Cardiolgy [facility]

## 2013-08-26 ENCOUNTER — Ambulatory Visit (INDEPENDENT_AMBULATORY_CARE_PROVIDER_SITE_OTHER): Payer: BC Managed Care – PPO | Admitting: Gynecology

## 2013-08-26 ENCOUNTER — Encounter: Payer: Self-pay | Admitting: Gynecology

## 2013-08-26 VITALS — BP 122/76 | HR 60 | Resp 16 | Ht 62.25 in | Wt 171.0 lb

## 2013-08-26 DIAGNOSIS — Z975 Presence of (intrauterine) contraceptive device: Secondary | ICD-10-CM

## 2013-08-26 HISTORY — DX: Presence of (intrauterine) contraceptive device: Z97.5

## 2013-08-26 NOTE — Progress Notes (Signed)
Subjective:     Patient ID: Autumn Robinson, female   DOB: 1965/08/21, 48 y.o.   MRN: [id number]  HPI Comments: Pt here for IUD check.  Pt was seen a few days after placement for cramping and pain and was noted to have a right sided cyst 2.7cm and IUD noted in correct position in cavity.  Pt states that her last cycle was 22d instead of usual 28d, this is her first abnormal cycle.  No dyspareunia.    Review of Systems Per HPI    Objective:   Physical Exam  Nursing note and vitals reviewed. Constitutional: She is oriented to person, place, and time. She appears well-developed and well-nourished.  Genitourinary: Vagina normal and uterus normal. There is no tenderness or lesion on the right labia. There is no tenderness or lesion on the left labia. Uterus is not tender. Cervix exhibits no motion tenderness and no discharge. Right adnexum displays no mass and no tenderness. Left adnexum displays no mass and no tenderness. No vaginal discharge found.  Cervix: IUD strings noted, no cmt  Lymphadenopathy:       Right: No inguinal adenopathy present.       Left: No inguinal adenopathy present.  Neurological: She is alert and oriented to person, place, and time.  Skin: Skin is dry.       Assessment:     IUD check Irregular cycle     Plan:     IUD in place Shortened cycle may be related to hemorrhagic cyst this month, to watch Overall doing well

## 2013-12-21 ENCOUNTER — Encounter: Payer: Self-pay | Admitting: Gynecology

## 2014-03-31 ENCOUNTER — Ambulatory Visit (INDEPENDENT_AMBULATORY_CARE_PROVIDER_SITE_OTHER): Payer: BC Managed Care – PPO | Admitting: Cardiology

## 2014-03-31 ENCOUNTER — Encounter: Payer: Self-pay | Admitting: Cardiology

## 2014-03-31 VITALS — BP 132/60 | HR 92 | Ht 62.0 in | Wt 182.4 lb

## 2014-03-31 DIAGNOSIS — E669 Obesity, unspecified: Secondary | ICD-10-CM

## 2014-03-31 DIAGNOSIS — I471 Supraventricular tachycardia: Secondary | ICD-10-CM

## 2014-03-31 MED ORDER — METOPROLOL TARTRATE 25 MG PO TABS: 50.0000 mg | ORAL_TABLET | Freq: Two times a day (BID) | ORAL | Status: DC | PRN

## 2014-03-31 NOTE — Patient Instructions (Signed)
You seem to be doing well.  Keep it up with the Gag maneuver to break your fast heart rate spells -- try drinking cold water if that does not work Continue with as needed Metoprolol when you have bad spells.  Your physician recommends that you schedule a follow-up appointment in: 1 yr. .inst

## 2014-04-02 NOTE — Progress Notes (Signed)
PATIENT: Autumn Robinson MRN: [id number] DOB: 11/09/1965 PCP: Florina Ou, MD  Clinic Note: Chief Complaint  Patient presents with  . 6 MONHT VISIT    EVERY ONC IN [facility] CHEST DISCOMFORT - ABOUT 4 TIMES, NO SOB , EDEMA WITH DISCOMFORT,--- will have infusion tomorrow- REMICADE    HPI: Autumn Robinson is a 48 y.o. female who works at [employer] with a [place] below who presents today for no new cardiologist - she had been seeing a cardiologist (Dr. Tomie China) at [facility] cardiology. She has a diagnosis of symptomatic recurrent PSVT. She is not actually had true syncopal episodes but has had recurrent bouts of SVT. She knows what triggers aren't as include caffeine and sweets and as well as chocolate. She tries to avoid especially caffeine. She's done relatively the document is in a neutral without any medications. She went to visit this visit up when she hasn't had palpitations while the GI office to be evaluated. In the past she had been on a beta blocker which he tolerated. Metoprolol 50 twice a day made her extremely groggy. She had not been tried on a lower dose. She discussed the possibility of EP evaluation for possible catheter ablation, and they decided that was not yet solid need to be considered due to the lack of frequency and overall symptoms.  Cardiac evaluation to date included an event monitor that actually did show any intermittent PSVT. 2-D echocardiogram: Normal LV size and function, EF 60-65%. No regional wall motion abnormalities. No notable valvular lesions. No effusion.  I saw her in June of 2015. We discussed different ways of treating her palpitations. She got very good fatigued with the beta blocker dose and is using it more as a when necessary basis. She is down to taking a 50 mg tablet as needed for an SVT episode. It really what she is noted is that she is able to break her episodes would be vagal maneuvers that talked about in the last visit.  Interval History: she said  that she tried many different maneuvers but doing the gag maneuver and drinking cold water has worked axillae for breaking her episodes. She still has them relatively frequently but is able to block them immediately when they come on. They don't last more than a minute because she he will stop the patient one that lasted 5 minutes, so she took an as needed metoprolol. Otherwise her cardiac standpoint she is doing axillae. The remainder of cardiac review of systems is as follows: Cardiovascular ROS: no chest pain or dyspnea on exertion positive for - irregular heartbeat, palpitations and rapid heart rate negative for - chest pain, edema, loss of consciousness, murmur, orthopnea, paroxysmal nocturnal dyspnea, shortness of breath or Syncope/near syncope, TIAs amaurosis fugax, melena, hematochezia, hematuria, epistaxis; claudication. :  Past Medical History  Diagnosis Date  . Crohn's disease   . PSVT (paroxysmal supraventricular tachycardia)     Diagnosed in [facility] by Dr. Tomie China  . Allergy     seasonal    Prior Cardiac Evaluation and Past Surgical History: Past Surgical History  Procedure Laterality Date  . Abcess drainage  1997, 1998, and again 8 years later    multiple perianal abcesses, several surgeries  . Transthoracic echocardiogram  October 2014    Normal LV size and function, EF 60-65%. No regional wall motion abnormalities. No notable valvular lesions. No effusion.  . Cardiac event monitor  October-November 2014    Demonstrated PSVT  Allergies  Allergen Reactions  . Sulfa Antibiotics Nausea Only and Other (See Comments)    Exhaustion    Current Outpatient Prescriptions  Medication Sig Dispense Refill  . inFLIXimab (REMICADE) 100 MG injection Inject into the vein.    Marland Kitchen LORazepam (ATIVAN) 1 MG tablet 1 by mouth every 8 weeks before Remicaide injection    . Magnesium 200 MG TABS Take 1 tablet by mouth daily.    . Melatonin 3 MG TABS Take 1 tablet by mouth at  bedtime.    . methocarbamol (ROBAXIN) 750 MG tablet Take 750 mg by mouth 4 (four) times daily as needed.    . metoprolol tartrate (LOPRESSOR) 25 MG tablet Take 2 tablets (50 mg total) by mouth 2 (two) times daily as needed. 30 tablet 11  . POTASSIUM CHLORIDE PO Take 2 tablets by mouth daily.     No current facility-administered medications for this visit.    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 Smokes about one pack a day. Rare alcohol consumption. She uses and for anxiety around her apartment for medication infusion for her Crohn's.         Family History : Allergies in her father and mother; Arthritis in an other family member; Bone cancer in her maternal grandfather; Cancer in her maternal grandfather and maternal grandmother; Coronary artery disease in an other family member; Crohn's disease in her sister; Diabetes in her father and another family member; Esophageal cancer in her maternal grandmother; GER disease in her mother; Heart disease in her paternal grandfather; Hyperlipidemia in an other family member; Hypertension in her father, mother, and another family member; Macular degeneration in her maternal grandmother; Osteoporosis in an other family member; Stroke in her paternal grandfather.  ROS: A comprehensive Review of Systems - Negative except Intermittent GI discomfort from her Crohn's. No recent exacerbations.  PHYSICAL EXAM BP 132/60 mmHg  Pulse 92  Ht 5' 2"  (1.575 m)  Wt 182 lb 6.4 oz (82.736 kg)  BMI 33.35 kg/m2 General appearance: alert, cooperative, appears stated age, no distress and Pleasant mood and affect. Borderline obese HEENT: [place]/AT, EOMI, MMM, anicteric sclera Neck: no adenopathy, no carotid bruit, no JVD, supple, symmetrical, trachea midline and thyroid not enlarged, symmetric, no tenderness/mass/nodules Lungs: clear to auscultation bilaterally,  normal percussion bilaterally and Nonlabored, good air movement Heart: regular rate and rhythm, S1, S2 normal, no murmur, click, rub or gallop and normal apical impulse Abdomen: soft, non-tender; bowel sounds normal; no masses,  no organomegaly Extremities: extremities normal, atraumatic, no cyanosis or edema Pulses: 2+ and symmetric Skin: Skin color, texture, turgor normal. No rashes or lesions Neurologic: Alert and oriented X 3, normal strength and tone. Normal symmetric reflexes. Normal coordination and gait   Adult ECG Report  Rate: 92;  Rhythm: normal sinus rhythm and Possible anterior MI, age undetermined. Likely normal variant. ; stable, no change Recent Labs: No recent labs  ASSESSMENT / PLAN: Very pleasant relatively young woman with a diagnosis of PSVT. Relatively controlled with avoiding triggers such as excess caffeine, chocolate or sweets and is able to walk and now with using vagal maneuver. We also talked about using cold water. She has when necessary beta blocker as she did not tolerate standing dose. Unfortunately she is not exercising much he used to and has gained weight.  PSVT (paroxysmal supraventricular tachycardia) Well controlled with avoiding triggers  and breaking them with vagal maneuvers. Continue when necessary beta blocker.  Allergies she does not have clinical symptoms, we would like to avoid referral for ablation.   Obesity (BMI 30-39.9) The patient understands the need to lose weight with diet and exercise. We have discussed specific strategies for this.     Orders Placed This Encounter  Procedures  . EKG 12-Lead   Meds ordered this encounter  Medications  . methocarbamol (ROBAXIN) 750 MG tablet    Sig: Take 750 mg by mouth 4 (four) times daily as needed.  . metoprolol tartrate (LOPRESSOR) 25 MG tablet    Sig: Take 2 tablets (50 mg total) by mouth 2 (two) times daily as needed.    Dispense:  30 tablet    Refill:  11    Followup: 6  months  Jazyiah Yiu W. Ellyn Hack, M.D., M.S. Interventional Cardiolgy [facility]

## 2014-04-02 NOTE — Assessment & Plan Note (Signed)
Well controlled with avoiding triggers and breaking them with vagal maneuvers. Continue when necessary beta blocker.  Allergies she does not have clinical symptoms, we would like to avoid referral for ablation.

## 2014-04-02 NOTE — Assessment & Plan Note (Signed)
The patient understands the need to lose weight with diet and exercise. We have discussed specific strategies for this.  

## 2015-03-19 ENCOUNTER — Ambulatory Visit (HOSPITAL_COMMUNITY): Payer: Self-pay

## 2015-03-19 ENCOUNTER — Ambulatory Visit (INDEPENDENT_AMBULATORY_CARE_PROVIDER_SITE_OTHER): Payer: BC Managed Care – PPO | Admitting: Physician Assistant

## 2015-03-19 ENCOUNTER — Ambulatory Visit (INDEPENDENT_AMBULATORY_CARE_PROVIDER_SITE_OTHER): Payer: BC Managed Care – PPO

## 2015-03-19 VITALS — BP 118/72 | HR 100 | Temp 99.0°F | Resp 16 | Ht 62.0 in | Wt 161.0 lb

## 2015-03-19 DIAGNOSIS — R0602 Shortness of breath: Secondary | ICD-10-CM | POA: Diagnosis not present

## 2015-03-19 DIAGNOSIS — R059 Cough, unspecified: Secondary | ICD-10-CM

## 2015-03-19 DIAGNOSIS — R05 Cough: Secondary | ICD-10-CM | POA: Diagnosis not present

## 2015-03-19 MED ORDER — HYDROCOD POLST-CPM POLST ER 10-8 MG/5ML PO SUER: 5.0000 mL | Freq: Two times a day (BID) | ORAL | Status: DC | PRN

## 2015-03-19 MED ORDER — CLARITHROMYCIN ER 500 MG PO TB24: 1000.0000 mg | ORAL_TABLET | Freq: Every day | ORAL | Status: AC

## 2015-03-19 MED ORDER — ALBUTEROL SULFATE HFA 108 (90 BASE) MCG/ACT IN AERS: 2.0000 | INHALATION_SPRAY | RESPIRATORY_TRACT | Status: DC | PRN

## 2015-03-19 NOTE — Progress Notes (Signed)
Subjective:    Patient ID: Autumn Robinson, female    DOB: 06-18-1965, 50 y.o.   MRN: [id number]  Chief Complaint  Patient presents with  . Cough    States she chest rattles when she breathing -   . Fever    Had a fever on Monday   . Generalized Body Aches    HPI Presents today for cough, with associated fever and generalized body aches. Pertinent PMhx of GERD, Pleurisy, and chest pain. Former smoker, 25 pack year hx. Began Sunday morning, 03/13/15, when she noticed feeling "yucky" with a sore throat. Progressed to generalized aches with a fever of 102F on Monday, 03/14/15. Tuesday, 03/15/15, temperature reduced to 99.75F. Wednesday, 03/16/15, the cough began as a "dry tickle cough" with associated sinus congestion, ear pressure and rhinorrhea. Thursday, 03/17/15, she received her Remidcade therapy. That night the cough progressed to "hacking" as if "she had cement in her chest". On Friday, 03/18/15, she began to hear her "chest rattle" (wheeze) and watery stools x10. She also reports Friday she no longer had the sinus congestion, or fullness of her ears. Has tried Mucinex without relief. Tried hot tea and cough drops which relieved the the dry cough, but not longer is helpful with the present cough. Additional associated symptoms include fatigue, sleep disturbance, anorexia, HA, polydipsia, decreased urine volume, nausea, light-headedness, chest tightness, chest pain, SOB, DOE, and orthopnea (increased in # pillows - 2 pillows and cat bed on couch vs normally 2 flat pillows). Has not received an updated influenza vaccine.   Describes bad taste in her mouth and throat. Denies metallic taste or symptoms similar to that of GERD. Denies influenza vaccine.    Review of Systems  Constitutional: Positive for appetite change (anorexia) and fatigue. Negative for chills and unexpected weight change. Fever: resolved.  HENT: Negative for congestion (resolved), ear discharge, ear pain, rhinorrhea (resolved), sore  throat and tinnitus.   Eyes: Negative for pain and discharge.  Respiratory: Positive for cough, chest tightness, shortness of breath (and doe) and wheezing.   Cardiovascular: Positive for chest pain. Negative for palpitations and leg swelling.  Gastrointestinal: Positive for nausea and diarrhea. Negative for vomiting, abdominal pain, constipation and blood in stool.  Endocrine: Positive for polydipsia. Negative for polyuria.  Genitourinary: Positive for decreased urine volume (feeling dehydrated). Negative for urgency, hematuria and difficulty urinating.  Musculoskeletal: Positive for myalgias.  Neurological: Positive for light-headedness (due to not eating) and headaches. Syncope: near.   Allergies  Allergen Reactions  . Sulfa Antibiotics Nausea Only and Other (See Comments)    Exhaustion   Prior to Admission medications   Medication Sig Start Date End Date Taking? Authorizing Provider  inFLIXimab (REMICADE) 100 MG injection Inject into the vein.   Yes Historical Provider, MD  LORazepam (ATIVAN) 1 MG tablet 1 by mouth every 8 weeks before Remicaide injection 03/02/11  Yes Historical Provider, MD  Magnesium 200 MG TABS Take 1 tablet by mouth daily.   Yes Historical Provider, MD  metoprolol tartrate (LOPRESSOR) 25 MG tablet Take 2 tablets (50 mg total) by mouth 2 (two) times daily as needed. 03/31/14  Yes Leonie Man, MD  POTASSIUM CHLORIDE PO Take 2 tablets by mouth daily.   Yes Historical Provider, MD                       Melatonin 3 MG TABS Take 1 tablet by mouth at bedtime. Reported on 03/19/2015    Historical Provider, MD  methocarbamol (ROBAXIN) 750 MG tablet Take 750 mg by mouth 4 (four) times daily as needed. Reported on 03/19/2015 03/29/14   Historical Provider, MD   Patient Active Problem List   Diagnosis Date Noted  . IUD (intrauterine device) in place 08/26/2013  . Obesity (BMI 30-39.9) 08/10/2013  . PSVT (paroxysmal supraventricular tachycardia) ([place]) 08/10/2013  . HLD  (hyperlipidemia) 12/17/2012  . Crohn's disease ([place]) 05/01/2011       Objective:   Physical Exam  Constitutional: She is oriented to person, place, and time. She appears well-developed and well-nourished. Distressed: appears uncomfterable.  BP 118/72 mmHg  Pulse 100  Temp(Src) 99 F (37.2 C) (Oral)  Resp 16  Ht 5' 2"  (1.575 m)  Wt 161 lb (73.029 kg)  BMI 29.44 kg/m2  SpO2 98%   HENT:  Head: Normocephalic and atraumatic.  Right Ear: Hearing and external ear normal.  Left Ear: Hearing and external ear normal.  Nose: Nose normal.  Mouth/Throat: Uvula is midline, oropharynx is clear and moist and mucous membranes are normal. No oropharyngeal exudate.  Eyes: Conjunctivae are normal. Pupils are equal, round, and reactive to light. Right eye exhibits no discharge. Left eye exhibits no discharge.  Neck: Neck supple. No JVD present. No tracheal deviation present.  Cardiovascular: Normal rate, regular rhythm, S1 normal, S2 normal, normal heart sounds, intact distal pulses and normal pulses.  Exam reveals no gallop and no friction rub.   No murmur heard. Pulses:      Radial pulses are 2+ on the right side.       Posterior tibial pulses are 2+ on the right side.  No lower extremity edema Negative homan's  Pulmonary/Chest: Effort normal and breath sounds normal. No accessory muscle usage. No tachypnea. No respiratory distress. Dull to percussion: Possible RLL  She exhibits no tenderness, no bony tenderness, no crepitus, no edema, no swelling and no retraction.  Lymphadenopathy:       Head (right side): No submental, no submandibular, no tonsillar, no preauricular, no posterior auricular and no occipital adenopathy present.       Head (left side): No submental, no submandibular, no tonsillar, no preauricular, no posterior auricular and no occipital adenopathy present.    She has no cervical adenopathy.       Right: No supraclavicular adenopathy present.       Left: No supraclavicular  adenopathy present.  Neurological: She is alert and oriented to person, place, and time. She has normal reflexes.  Skin: Skin is warm and dry.  Psychiatric: She has a normal mood and affect. Her behavior is normal.  Vitals reviewed.   Dg Chest 2 View  03/19/2015  CLINICAL DATA:  Cough, shortness of Breath EXAM: CHEST  2 VIEW COMPARISON:  Chest CT 05/01/2011 FINDINGS: The heart size and mediastinal contours are within normal limits. Both lungs are clear. The visualized skeletal structures are unremarkable. IMPRESSION: No active cardiopulmonary disease. Electronically Signed   By: Rolm Baptise M.D.   On: 03/19/2015 10:32       Assessment & Plan:  1. Cough - DG Chest 2 View; Future - clarithromycin (BIAXIN XL) 500 MG 24 hr tablet; Take 2 tablets (1,000 mg total) by mouth daily.  Dispense: 20 tablet; Refill: 0 - albuterol (PROVENTIL HFA;VENTOLIN HFA) 108 (90 Base) MCG/ACT inhaler; Inhale 2 puffs into the lungs every 4 (four) hours as needed for wheezing or shortness of breath (cough, shortness of breath or wheezing.).  Dispense: 1 Inhaler; Refill: 1 - chlorpheniramine-HYDROcodone (TUSSIONEX PENNKINETIC ER) 10-8 MG/5ML  SUER; Take 5 mLs by mouth every 12 (twelve) hours as needed for cough.  Dispense: 100 mL; Refill: 0  2. SOB (shortness of breath) - DG Chest 2 View; Future  Return if symptoms worsen or fail to improve.

## 2015-03-19 NOTE — Progress Notes (Signed)
Patient ID: Autumn Robinson, female    DOB: 1965-10-15, 50 y.o.   MRN: [id number]  PCP: Florina Ou, MD (provider has retired, and she has not established with new PCP).  Subjective:   Chief Complaint  Patient presents with  . Cough    States she chest rattles when she breathing -   . Fever    Had a fever on Monday   . Generalized Body Aches    HPI Presents for evaluation of cough, with associated fever and generalized body aches.   Pertinent PMhx of GERD, Pleurisy, and chest pain. Former smoker, 25 pack year hx.   Began Sunday morning, 03/13/15, when she noticed feeling "yucky" with a sore throat. Progressed to generalized aches with a fever of 102F on Monday, 03/14/15. Tuesday, 03/15/15, temperature reduced to 99.5F. Wednesday, 03/16/15, the cough began as a "dry tickle cough" with associated sinus congestion, ear pressure and rhinorrhea.   Thursday, 03/17/15, she received her Remidcade therapy. That night the cough progressed to "hacking" as if "she had cement in her chest".   On Friday, 03/18/15, she began to hear her "chest rattle" (wheeze) and watery stools x10. She also reports Friday she no longer had the sinus congestion, or fullness of her ears. Has tried Mucinex without relief. Tried hot tea and cough drops which relieved the the dry cough, but not longer is helpful with the present cough.   Additional associated symptoms include fatigue, sleep disturbance, anorexia, HA, polydipsia, decreased urine volume, nausea, light-headedness, chest tightness, chest pain, SOB, DOE, and orthopnea (increased in # pillows - 2 pillows and cat bed on couch vs normally 2 flat pillows). Has not received a seasonal influenza vaccine.   Describes bad taste in her mouth and throat. Denies metallic taste or symptoms similar to that of GERD. Denies influenza vaccine. .    Review of Systems Constitutional: Positive for appetite change (anorexia) and fatigue. Negative for chills and unexpected weight  change. Fever: resolved.  HENT: Negative for congestion (resolved), ear discharge, ear pain, rhinorrhea (resolved), sore throat and tinnitus.  Eyes: Negative for pain and discharge.  Respiratory: Positive for cough, chest tightness, shortness of breath (and doe) and wheezing.  Cardiovascular: Positive for chest pain. Negative for palpitations and leg swelling.  Gastrointestinal: Positive for nausea and diarrhea. Negative for vomiting, abdominal pain, constipation and blood in stool.  Endocrine: Positive for polydipsia. Negative for polyuria.  Genitourinary: Positive for decreased urine volume (feeling dehydrated). Negative for urgency, hematuria and difficulty urinating.  Musculoskeletal: Positive for myalgias.  Neurological: Positive for light-headedness (due to not eating) and headaches. Syncope: near.     Patient Active Problem List   Diagnosis Date Noted  . IUD (intrauterine device) in place 08/26/2013  . Obesity (BMI 30-39.9) 08/10/2013  . PSVT (paroxysmal supraventricular tachycardia) ([place]) 08/10/2013  . HLD (hyperlipidemia) 12/17/2012  . 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 Crohn's disease ([place]) 05/01/2011     Prior to Admission medications   Medication Sig Start Date End Date Taking? Authorizing Provider  inFLIXimab (REMICADE) 100 MG injection Inject into the vein.   Yes Historical Provider, MD  LORazepam (ATIVAN) 1 MG tablet 1 by mouth every 8 weeks before Remicaide injection 03/02/11  Yes Historical Provider, MD  Magnesium 200 MG TABS Take 1 tablet by mouth daily.   Yes Historical Provider, MD  metoprolol tartrate (LOPRESSOR) 25 MG tablet Take 2 tablets (50 mg total) by mouth 2 (two) times daily as needed. 03/31/14  Yes Leonie Man, MD  POTASSIUM CHLORIDE PO Take 2 tablets by mouth daily.   Yes Historical Provider, MD  Melatonin 3 MG TABS Take 1 tablet by mouth at bedtime. Reported on 03/19/2015    Historical Provider, MD  methocarbamol (ROBAXIN) 750 MG tablet  Take 750 mg by mouth 4 (four) times daily as needed. Reported on 03/19/2015 03/29/14   Historical Provider, MD     Allergies  Allergen Reactions  . Sulfa Antibiotics Nausea Only and Other (See Comments)    Exhaustion       Objective:  Physical Exam  Constitutional: She is oriented to person, place, and time. Vital signs are normal. She appears well-developed and well-nourished. She is active and cooperative. No distress.  BP 118/72 mmHg  Pulse 100  Temp(Src) 99 F (37.2 C) (Oral)  Resp 16  Ht 5' 2"  (1.575 m)  Wt 161 lb (73.029 kg)  BMI 29.44 kg/m2  SpO2 98% She smells of smoke, but remains a non-smoker. Her husband smokes.  HENT:  Head: Normocephalic and atraumatic.  Right Ear: Hearing, external ear and ear canal normal.  Left Ear: Hearing, external ear and ear canal normal.  Nose: Nose normal.  Mouth/Throat: Oropharynx is clear and moist and mucous membranes are normal. No oral lesions.  Dulled TMs bilaterally.  Eyes: Conjunctivae are normal. No scleral icterus.  Neck: Normal range of motion. Neck supple. No thyromegaly present.  Cardiovascular: Normal rate, regular rhythm and normal heart sounds.   Pulses:      Radial pulses are 2+ on the right side, and 2+ on the left side.  Pulmonary/Chest: Effort normal. She has wheezes (occasional and scattered, high-pitched).  Lymphadenopathy:       Head (right side): No tonsillar, no preauricular, no posterior auricular and no occipital adenopathy present.       Head (left side): No tonsillar, no preauricular, no posterior auricular and no occipital adenopathy present.    She has no cervical adenopathy.       Right: No supraclavicular adenopathy present.       Left: No supraclavicular adenopathy present.  Neurological: She is alert and oriented to person, place, and time. No sensory deficit.  Skin: Skin is warm, dry and intact. No rash noted. No cyanosis or erythema. Nails show no clubbing.  Psychiatric: She has a normal mood and  affect.    Dg Chest 2 View  03/19/2015  CLINICAL DATA:  Cough, shortness of Breath EXAM: CHEST  2 VIEW COMPARISON:  Chest CT 05/01/2011 FINDINGS: The heart size and mediastinal contours are within normal limits. Both lungs are clear. The visualized skeletal structures are unremarkable. IMPRESSION: No active cardiopulmonary disease. Electronically Signed   By: Rolm Baptise M.D.   On: 03/19/2015 10:32          Assessment & Plan:   1. Cough 2. SOB (shortness of breath) While this may be viral, she is at higher risk due to immunosuppression on Remicaide. Cover for borth sinus and pulmonary etiologies.  - DG Chest 2 View; Future - clarithromycin (BIAXIN XL) 500 MG 24 hr tablet; Take 2 tablets (1,000 mg total) by mouth daily.  Dispense: 20 tablet; Refill: 0 - albuterol (PROVENTIL HFA;VENTOLIN HFA) 108 (90 Base) MCG/ACT inhaler; Inhale 2 puffs into the lungs every 4 (four) hours as needed for wheezing or shortness of breath (cough, shortness of breath or wheezing.).  Dispense: 1 Inhaler; Refill: 1 - chlorpheniramine-HYDROcodone (TUSSIONEX PENNKINETIC ER) 10-8 MG/5ML SUER; Take 5 mLs by mouth every 12 (twelve) hours as needed for cough.  Dispense: 100 mL; Refill: 0  Return if symptoms worsen or fail to improve.  Discussed with Dr. Tamala Julian.   Fara Chute, PA-C Physician Assistant-Certified [facility]

## 2015-03-19 NOTE — Patient Instructions (Signed)
Get plenty of rest and drink at least 64 ounces of water daily. 

## 2015-05-03 ENCOUNTER — Ambulatory Visit (INDEPENDENT_AMBULATORY_CARE_PROVIDER_SITE_OTHER): Payer: BC Managed Care – PPO | Admitting: Cardiology

## 2015-05-03 ENCOUNTER — Encounter: Payer: Self-pay | Admitting: Cardiology

## 2015-05-03 VITALS — BP 110/84 | HR 86 | Ht 62.0 in | Wt 165.6 lb

## 2015-05-03 DIAGNOSIS — E785 Hyperlipidemia, unspecified: Secondary | ICD-10-CM | POA: Diagnosis not present

## 2015-05-03 DIAGNOSIS — I471 Supraventricular tachycardia: Secondary | ICD-10-CM

## 2015-05-03 DIAGNOSIS — E669 Obesity, unspecified: Secondary | ICD-10-CM

## 2015-05-03 MED ORDER — METOPROLOL TARTRATE 25 MG PO TABS: 50.0000 mg | ORAL_TABLET | Freq: Two times a day (BID) | ORAL | Status: DC | PRN

## 2015-05-03 NOTE — Patient Instructions (Signed)
NO CHANGE IN CURRENT MEDICATIONS  Your physician wants you to follow-up in [place].  You will receive a reminder letter in the mail two months in advance. If you don't receive a letter, please call our office to schedule the follow-up appointment.  If you need a refill on your cardiac medications before your next appointment, please call your pharmacy.

## 2015-05-03 NOTE — Progress Notes (Signed)
PCP: Florina Ou, MD  Clinic Note: Chief Complaint  Patient presents with  . Annual Exam     no chest pain, no shortness of breath, no edema, no pain or cramping in legs, no lightheadedness or dizziness, has fatigue    HPI: Autumn Robinson is a 50 y.o. female with a PMH below who presents today for annual f/u for PSVT.  No syncope.  Autumn Robinson was last seen in Feb 2016 as initial visit for recurrent PSVT.  Reduced dose of Metoprolol to 25 mg.  Recent Hospitalizations: None  Studies Reviewed: No new studies  Interval History: Autumn Robinson is been doing very well over the last year. She has had one episode over the summer and it lasted about an hour she was a little bit stressed out and tried vagal maneuvers and this time it didn't really work when it does. She took a next dose of metoprolol and the finding weaned off. Other than that one episode that was a little scary, she is not had any significant arrhythmias. She still has some palpitations" him fringes. But not anything like she had before. She's been basically taking the metoprolol when necessary. Other than the one prolonged episode, she is felt PVCs coming on with more frequency at least 3 additional times and is taking her metoprolol is a standing dose for the 2-3 days concern.  No chest pain or shortness of breath with rest or exertion. No PND, orthopnea or edema.  No lightheadedness, dizziness (except during her one prolonged episode), weakness or syncope/near syncope. No TIA/amaurosis fugax symptoms. No melena, hematochezia, hematuria, or epstaxis. No claudication.  ROS: A comprehensive was performed. Review of Systems  Constitutional: Negative for malaise/fatigue and diaphoresis.  Respiratory: Negative for cough and shortness of breath.   Cardiovascular: Positive for palpitations. Negative for chest pain and leg swelling.  Gastrointestinal: Negative for blood in stool and melena.  Genitourinary: Negative for hematuria.    Musculoskeletal: Negative for myalgias, back pain and neck pain.  Neurological: Negative for weakness and headaches.  Endo/Heme/Allergies: Does not bruise/bleed easily.  All other systems reviewed and are negative.    Past Medical History  Diagnosis Date  . Crohn's disease ([place])   . PSVT (paroxysmal supraventricular tachycardia) ([place])     Diagnosed in [facility] by Dr. Tomie China  . Allergy     seasonal  . 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 Moderate episode of recurrent major depressive disorder ([place]) 12/17/2012  . GERD (gastroesophageal reflux disease) 05/01/2011  . Chest pain 06/05/2011    CT chest 04/2011:  No infiltrate, no effusion, no PE EKG 2013:  No change to suggest pericarditis     Past Surgical History  Procedure Laterality Date  . Abcess drainage  1997, 1998, and again 8 years later    multiple perianal abcesses, several surgeries  . Transthoracic echocardiogram  October 2014    Normal LV size and function, EF 60-65%. No regional wall motion abnormalities. No notable valvular lesions. No effusion.  . Cardiac event monitor  October-November 2014    Demonstrated PSVT    Prior to Admission medications   Medication Sig Start Date End Date Taking? Authorizing Provider  inFLIXimab (REMICADE) 100 MG injection Inject into the vein.   Yes Historical Provider, MD  LORazepam (ATIVAN) 1 MG tablet 1 by mouth every 8 weeks before Remicaide injection 03/02/11  Yes Historical Provider, MD  Magnesium 200 MG TABS Take 1 tablet by mouth daily.   Yes Historical Provider, MD  metoprolol tartrate (LOPRESSOR) 25 MG tablet Take 2 tablets (50 mg total) by mouth 2 (two) times daily as needed. 03/31/14  Yes Leonie Man, MD  POTASSIUM CHLORIDE PO Take 2 tablets by mouth daily.   Yes Historical Provider, MD   Allergies  Allergen Reactions  . Sulfa Antibiotics Nausea Only and Other (See Comments)    Exhaustion     Social History   Social History  . Marital Status: Married    Spouse  Name: N/A  . Number of Children: N/A  . Years of Education: N/A   Social History Main Topics  . Smoking status: Former Smoker -- 1.00 packs/day for 25 years    Types: Cigarettes    Start date: 08/03/1988  . Smokeless tobacco: Never Used  . Alcohol Use: No  . Drug Use: No  . Sexual Activity:    Partners: Male    Birth Control/ Protection: IUD     Comment: paragard   Other Topics Concern  . 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 She has a graduate school agree to. Smokes about one pack a day. Rare alcohol consumption. She uses and for anxiety around her apartment for medication infusion for her Crohn's.         Family History. family history includes Allergies in her father and mother; Bone cancer in her maternal grandfather; Cancer in her maternal grandfather and maternal grandmother; Crohn's disease in her sister; Diabetes in her father; Esophageal cancer in her maternal grandmother; GER disease in her mother; Heart disease in her paternal grandfather; Hypertension in her father and mother; Macular degeneration in her maternal grandmother; Stroke in her paternal grandfather.    Wt Readings from Last 3 Encounters:  05/03/15 165 lb 9 oz (75.099 kg)  03/19/15 161 lb (73.029 kg)  03/31/14 182 lb 6.4 oz (82.736 kg)  *((*  PHYSICAL EXAM BP 110/84 mmHg  Pulse 86  Ht 5' 2"  (1.575 m)  Wt 165 lb 9 oz (75.099 kg)  BMI 30.27 kg/m2  LMP 05/01/2015 General appearance: alert, cooperative, appears stated age, no distress and Pleasant mood and affect. Borderline obese HEENT: [place]/AT, EOMI, MMM, anicteric sclera Neck: no adenopathy, no carotid bruit, no JVD, supple, symmetrical, trachea midline and thyroid not enlarged, symmetric, no tenderness/mass/nodules Lungs: clear to auscultation bilaterally, normal percussion bilaterally and Nonlabored, good air movement Heart: regular rate and rhythm, S1, S2 normal, no murmur,  click, rub or gallop and normal apical impulse Abdomen: soft, non-tender; bowel sounds normal; no masses, no organomegaly Extremities: extremities normal, atraumatic, no cyanosis or edema Pulses: 2+ and symmetric Skin: Skin color, texture, turgor normal. No rashes or lesions Neurologic: Alert and oriented X 3, normal strength and tone. Normal symmetric reflexes. Normal coordination and gait    Adult ECG Report  Rate: 87 ;  Rhythm: normal sinus rhythm and Anterior infarct, age undetermined.;   Narrative Interpretation: Stable EKG. No significant change from last   Other studies Reviewed: Additional studies/ records that were reviewed today include:  Recent Labs:  Stable EKG     ASSESSMENT / PLAN: Problem List Items Addressed This Visit    PSVT (paroxysmal supraventricular tachycardia) ([place]) - Primary (Chronic)    Seem to be relatively well controlled. Very fleeting episodes. Nothing that was overly symptomatic besides the one spell. Usually broken by vagal maneuvers. This time it wasn't. She did take an additional dose of Lopressor which we rediscussed  using Lopressor when necessary for additional doses. Currently not symptomatically better consider referral for ablation procedure, that is one potential option.      Relevant Medications   metoprolol tartrate (LOPRESSOR) 25 MG tablet   Other Relevant Orders   EKG 12-Lead   Obesity (BMI 30-39.9) (Chronic)    We tended to compare notes as far as weight loss. She has lost 15+ pounds February 2016.  I congratulated her on her efforts.Marland Kitchen      HLD (hyperlipidemia)   Relevant Medications   metoprolol tartrate (LOPRESSOR) 25 MG tablet   Other Relevant Orders   EKG 12-Lead      Current medicines are reviewed at length with the patient today. (+/- concerns) none The following changes have been made:  Studies Ordered:   Orders Placed This Encounter  Procedures  . EKG 12-Lead      Leonie Man, M.D., M.S. Interventional  Cardiologist   Pager # [phone] Phone # [phone] [address] [place], [place] [address]

## 2015-05-04 NOTE — Assessment & Plan Note (Signed)
Seem to be relatively well controlled. Very fleeting episodes. Nothing that was overly symptomatic besides the one spell. Usually broken by vagal maneuvers. This time it wasn't. She did take an additional dose of Lopressor which we rediscussed using Lopressor when necessary for additional doses. Currently not symptomatically better consider referral for ablation procedure, that is one potential option.

## 2015-05-04 NOTE — Assessment & Plan Note (Signed)
We tended to compare notes as far as weight loss. She has lost 15+ pounds February 2016.  I congratulated her on her efforts.Marland Kitchen

## 2015-05-13 ENCOUNTER — Telehealth: Payer: Self-pay | Admitting: Family Medicine

## 2015-05-13 NOTE — Telephone Encounter (Signed)
Rcvd records from Dr Glenetta Borg [facility]/office notes/labs

## 2015-05-26 ENCOUNTER — Ambulatory Visit (INDEPENDENT_AMBULATORY_CARE_PROVIDER_SITE_OTHER): Payer: BC Managed Care – PPO | Admitting: Family Medicine

## 2015-05-26 ENCOUNTER — Encounter: Payer: Self-pay | Admitting: Family Medicine

## 2015-05-26 VITALS — BP 116/68 | HR 76 | Wt 170.2 lb

## 2015-05-26 DIAGNOSIS — Z8659 Personal history of other mental and behavioral disorders: Secondary | ICD-10-CM | POA: Diagnosis not present

## 2015-05-26 DIAGNOSIS — Z7189 Other specified counseling: Secondary | ICD-10-CM

## 2015-05-26 DIAGNOSIS — Z7689 Persons encountering health services in other specified circumstances: Secondary | ICD-10-CM

## 2015-05-26 DIAGNOSIS — Z8679 Personal history of other diseases of the circulatory system: Secondary | ICD-10-CM | POA: Diagnosis not present

## 2015-05-26 DIAGNOSIS — K5090 Crohn's disease, unspecified, without complications: Secondary | ICD-10-CM

## 2015-05-26 NOTE — Patient Instructions (Signed)
Dermatologist- Dr. Allyson Sabal [phone] [address] (next to [address])

## 2015-05-26 NOTE — Progress Notes (Signed)
   Subjective:    Patient ID: Autumn Robinson, female    DOB: November 09, 1965, 50 y.o.   MRN: [id number]  HPI Chief Complaint  Patient presents with  . new pt get est    new pt get established. no concerns   She is new to the practice and here to establish primary care. Has previously been going to [facility] and the clinic closed.  Concerns today: would like to see a dermatologist.  Has history of open fistulas.   Went to Urgent care over the winter for a couple of issues with bronchitis and was treated with antibiotics. History of staph infection in finger. History of seasonal affective disorder and taking Sam-e for same, states she did not have problem with SAD this winter and the supplement helps with her depression.   Other providers: Dr. Vertell Limber GI in [facility]- sees him 2 times per year. Remicade every 8 weeks.  Dr. Ellyn Hack- cardiolologist- sees him for palpitations, PSVT- metoprolol prn.  Gynecologist- [facility]. Has appointment this summer.  Mammogram- 2014- normal.  Pap smear- 2 years ago and always normal per patient IUD 2 years ago- heavy and irregular bleeding   Last physical: 2 years ago.  Labs: every 6 months   Surgeries: abscess drainage and fistula surgery.   Lives with: husband. No kids.  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   Denies smoking, alcohol use minimal, denies drug use  Review of Systems Pertinent positives and negatives in the history of present illness.     Objective:   Physical Exam BP 116/68 mmHg  Pulse 76  Wt 170 lb 3.2 oz (77.202 kg)  LMP 05/20/2015  Alert and oriented in no acute distress. Not otherwise examined.      Assessment & Plan:  Crohn's disease without complication, unspecified gastrointestinal tract location [facility])  Encounter to establish care  History of PSVT (paroxysmal supraventricular tachycardia)  History of depression  Her crohns appears well controlled on current medication regimen  although she does express some concern and issues with fistulas. She has discussed these concerns with her GI doctor and surgeon.  States she rarely has episodes of PSVT anymore but takes Metoprolol as needed for this. Follows up with cardiologist for this.  Depression appears to be stable on supplement Sam-e, no SAD this past winter.  Recommend that she return for a CPE and fasting labs at her convenience and I will need to review medical records prior to this.

## 2015-05-31 ENCOUNTER — Encounter: Payer: Self-pay | Admitting: Family Medicine

## 2015-06-13 ENCOUNTER — Encounter: Payer: Self-pay | Admitting: Family Medicine

## 2015-06-14 ENCOUNTER — Ambulatory Visit (INDEPENDENT_AMBULATORY_CARE_PROVIDER_SITE_OTHER): Payer: BC Managed Care – PPO | Admitting: Family Medicine

## 2015-06-14 ENCOUNTER — Encounter: Payer: Self-pay | Admitting: Family Medicine

## 2015-06-14 VITALS — BP 120/70 | HR 68 | Temp 98.8°F | Resp 16 | Wt 169.4 lb

## 2015-06-14 DIAGNOSIS — J069 Acute upper respiratory infection, unspecified: Secondary | ICD-10-CM | POA: Diagnosis not present

## 2015-06-14 NOTE — Progress Notes (Signed)
Subjective:  Autumn Robinson is a 50 y.o. female who presents for possible bronchitis.  Symptoms include a 5 day history of URI symptoms that started with sore throat, sinus pressure, post nasal drainage, low grade fever and 2 days ago she developed chest congestion and dry cough. Last night she noticed wheezing.  Denies ear pain, sore throat, chest pain, shortness of breath. Continues to have mild drainage. Chief complaint today is dry cough.   Treatment to date: Mucinex, vitamin C and albuterol inhaler as needed. states she has albuterol inhaler left over from recent illness.  ? sick contacts.   She does not smoke, former smoker.   She does have a history of bronchitis.   No other aggravating or relieving factors.  No other c/o. No recent antibiotic use.   The following portions of the patient's history were reviewed and updated as appropriate: allergies, current medications, past family history, past medical history, past social history, past surgical history and problem list.  ROS as in subjective  Past Medical History  Diagnosis Date  . Crohn's disease ([place])   . PSVT (paroxysmal supraventricular tachycardia) ([place])     Diagnosed in [facility] by Dr. Tomie China  . Allergy     seasonal  . 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 Moderate episode of recurrent major depressive disorder ([place]) 12/17/2012  . GERD (gastroesophageal reflux disease) 05/01/2011  . Chest pain 06/05/2011    CT chest 04/2011:  No infiltrate, no effusion, no PE EKG 2013:  No change to suggest pericarditis      Objective: Vital signs reviewed BP 120/70 mmHg  Pulse 68  Temp(Src) 98.8 F (37.1 C) (Oral)  Resp 16  Wt 169 lb 6.4 oz (76.839 kg)  LMP 05/20/2015   General appearance: Alert, WD/WN, no distress, is not ill appearing                             Skin: warm, no rash, no diaphoresis                           Head: no sinus tenderness                            Eyes: conjunctiva normal, corneas clear, PERRLA                     Ears: pearly TMs, external ear canals normal                          Nose: septum midline, turbinates swollen, with erythema and clear discharge             Mouth/throat: MMM, tongue normal, mild pharyngeal erythema                           Neck: supple, no adenopathy, no thyromegaly, nontender                          Heart: RRR, normal S1, S2, no murmurs                         Lungs: +bronchial breath sounds, no rhonchi, Left upper lobe expiratory wheezes, clear lung fields otherwise, no rales  Extremities: no edema, nontender     Assessment: Acute upper respiratory infection   Plan:    Discussed diagnosis and treatment of bronchitis.  Suggested symptomatic OTC remedies for cough and congestion.  Tylenol or Ibuprofen OTC for fever and malaise.  She will use albuterol inhaler as needed for cough. Call/return in 2-3 days if symptoms are worse or not improving. Will consider chest XR based on history of pneumonia if she is not improving or if she gets worse.  Advised that cough may linger even after the infection is improved.

## 2015-06-14 NOTE — Patient Instructions (Signed)
Call if you are not improving in next 2 days. Use the albuterol inhaler as needed for cough and take Mucinex. Stay well hydrated.   Upper Respiratory Infection, Adult Most upper respiratory infections (URIs) are a viral infection of the air passages leading to the lungs. A URI affects the nose, throat, and upper air passages. The most common type of URI is nasopharyngitis and is typically referred to as "the common cold." URIs run their course and usually go away on their own. Most of the time, a URI does not require medical attention, but sometimes a bacterial infection in the upper airways can follow a viral infection. This is called a secondary infection. Sinus and middle ear infections are common types of secondary upper respiratory infections. Bacterial pneumonia can also complicate a URI. A URI can worsen asthma and chronic obstructive pulmonary disease (COPD). Sometimes, these complications can require emergency medical care and may be life threatening.  CAUSES Almost all URIs are caused by viruses. A virus is a type of germ and can spread from one person to another.  RISKS FACTORS You may be at risk for a URI if:   You smoke.   You have chronic heart or lung disease.  You have a weakened defense (immune) system.   You are very young or very old.   You have nasal allergies or asthma.  You work in crowded or poorly ventilated areas.  You work in health care facilities or schools. SIGNS AND SYMPTOMS  Symptoms typically develop 2-3 days after you come in contact with a cold virus. Most viral URIs last 7-10 days. However, viral URIs from the influenza virus (flu virus) can last 14-18 days and are typically more severe. Symptoms may include:   Runny or stuffy (congested) nose.   Sneezing.   Cough.   Sore throat.   Headache.   Fatigue.   Fever.   Loss of appetite.   Pain in your forehead, behind your eyes, and over your cheekbones (sinus pain).  Muscle aches.   DIAGNOSIS  Your health care provider may diagnose a URI by:  Physical exam.  Tests to check that your symptoms are not due to another condition such as:  Strep throat.  Sinusitis.  Pneumonia.  Asthma. TREATMENT  A URI goes away on its own with time. It cannot be cured with medicines, but medicines may be prescribed or recommended to relieve symptoms. Medicines may help:  Reduce your fever.  Reduce your cough.  Relieve nasal congestion. HOME CARE INSTRUCTIONS   Take medicines only as directed by your health care provider.   Gargle warm saltwater or take cough drops to comfort your throat as directed by your health care provider.  Use a warm mist humidifier or inhale steam from a shower to increase air moisture. This may make it easier to breathe.  Drink enough fluid to keep your urine clear or pale yellow.   Eat soups and other clear broths and maintain good nutrition.   Rest as needed.   Return to work when your temperature has returned to normal or as your health care provider advises. You may need to stay home longer to avoid infecting others. You can also use a face mask and careful hand washing to prevent spread of the virus.  Increase the usage of your inhaler if you have asthma.   Do not use any tobacco products, including cigarettes, chewing tobacco, or electronic cigarettes. If you need help quitting, ask your health care provider. PREVENTION  The best way to protect yourself from getting a cold is to practice good hygiene.   Avoid oral or hand contact with people with cold symptoms.   Wash your hands often if contact occurs.  There is no clear evidence that vitamin C, vitamin E, echinacea, or exercise reduces the chance of developing a cold. However, it is always recommended to get plenty of rest, exercise, and practice good nutrition.  SEEK MEDICAL CARE IF:   You are getting worse rather than better.   Your symptoms are not controlled by  medicine.   You have chills.  You have worsening shortness of breath.  You have brown or red mucus.  You have yellow or brown nasal discharge.  You have pain in your face, especially when you bend forward.  You have a fever.  You have swollen neck glands.  You have pain while swallowing.  You have white areas in the back of your throat. SEEK IMMEDIATE MEDICAL CARE IF:   You have severe or persistent:  Headache.  Ear pain.  Sinus pain.  Chest pain.  You have chronic lung disease and any of the following:  Wheezing.  Prolonged cough.  Coughing up blood.  A change in your usual mucus.  You have a stiff neck.  You have changes in your:  Vision.  Hearing.  Thinking.  Mood. MAKE SURE YOU:   Understand these instructions.  Will watch your condition.  Will get help right away if you are not doing well or get worse.   This information is not intended to replace advice given to you by your health care provider. Make sure you discuss any questions you have with your health care provider.   Document Released: 08/01/2000 Document Revised: 06/22/2014 Document Reviewed: 05/13/2013 Elsevier Interactive Patient Education [employer].

## 2015-08-25 ENCOUNTER — Encounter: Payer: Self-pay | Admitting: Family Medicine

## 2015-08-25 ENCOUNTER — Ambulatory Visit (INDEPENDENT_AMBULATORY_CARE_PROVIDER_SITE_OTHER): Payer: BC Managed Care – PPO | Admitting: Family Medicine

## 2015-08-25 VITALS — BP 120/70 | HR 72 | Ht 62.0 in | Wt 174.0 lb

## 2015-08-25 DIAGNOSIS — Z0000 Encounter for general adult medical examination without abnormal findings: Secondary | ICD-10-CM | POA: Diagnosis not present

## 2015-08-25 DIAGNOSIS — E785 Hyperlipidemia, unspecified: Secondary | ICD-10-CM | POA: Diagnosis not present

## 2015-08-25 DIAGNOSIS — E669 Obesity, unspecified: Secondary | ICD-10-CM | POA: Diagnosis not present

## 2015-08-25 DIAGNOSIS — Z1239 Encounter for other screening for malignant neoplasm of breast: Secondary | ICD-10-CM | POA: Insufficient documentation

## 2015-08-25 HISTORY — DX: Encounter for general adult medical examination without abnormal findings: Z00.00

## 2015-08-25 LAB — CBC WITH DIFFERENTIAL/PLATELET
BASOS ABS: 67 {cells}/uL (ref 0–200)
Basophils Relative: 1 %
EOS PCT: 4 %
Eosinophils Absolute: 268 cells/uL (ref 15–500)
HEMATOCRIT: 41.3 % (ref 35.0–45.0)
HEMOGLOBIN: 14.4 g/dL (ref 11.7–15.5)
LYMPHS PCT: 36 %
Lymphs Abs: 2412 cells/uL (ref 850–3900)
MCH: 34 pg — AB (ref 27.0–33.0)
MCHC: 34.9 g/dL (ref 32.0–36.0)
MCV: 97.4 fL (ref 80.0–100.0)
MPV: 9 fL (ref 7.5–12.5)
Monocytes Absolute: 536 cells/uL (ref 200–950)
Monocytes Relative: 8 %
NEUTROS PCT: 51 %
Neutro Abs: 3417 cells/uL (ref 1500–7800)
Platelets: 222 10*3/uL (ref 140–400)
RBC: 4.24 MIL/uL (ref 3.80–5.10)
RDW: 13 % (ref 11.0–15.0)
WBC: 6.7 10*3/uL (ref 4.0–10.5)

## 2015-08-25 LAB — POCT URINALYSIS DIPSTICK
Bilirubin, UA: NEGATIVE
Glucose, UA: NEGATIVE
KETONES UA: NEGATIVE
Leukocytes, UA: NEGATIVE
NITRITE UA: NEGATIVE
PROTEIN UA: NEGATIVE
RBC UA: NEGATIVE
SPEC GRAV UA: 1.01
UROBILINOGEN UA: NEGATIVE
pH, UA: 7.5

## 2015-08-25 LAB — COMPREHENSIVE METABOLIC PANEL
ALBUMIN: 4.3 g/dL (ref 3.6–5.1)
ALT: 21 U/L (ref 6–29)
AST: 23 U/L (ref 10–35)
Alkaline Phosphatase: 55 U/L (ref 33–115)
BILIRUBIN TOTAL: 0.7 mg/dL (ref 0.2–1.2)
BUN: 5 mg/dL — ABNORMAL LOW (ref 7–25)
CALCIUM: 9.4 mg/dL (ref 8.6–10.2)
CHLORIDE: 97 mmol/L — AB (ref 98–110)
CO2: 27 mmol/L (ref 20–31)
Creat: 0.62 mg/dL (ref 0.50–1.10)
Glucose, Bld: 86 mg/dL (ref 65–99)
Potassium: 4.7 mmol/L (ref 3.5–5.3)
Sodium: 131 mmol/L — ABNORMAL LOW (ref 135–146)
TOTAL PROTEIN: 7.7 g/dL (ref 6.1–8.1)

## 2015-08-25 LAB — LIPID PANEL
CHOL/HDL RATIO: 3.9 ratio (ref <=5.0)
CHOLESTEROL: 330 mg/dL — AB (ref 125–200)
HDL: 85 mg/dL (ref >=46)
LDL CALC: 231 mg/dL — AB (ref <130)
Triglycerides: 72 mg/dL (ref <150)
VLDL: 14 mg/dL (ref <30)

## 2015-08-25 NOTE — Patient Instructions (Addendum)
Call the breast center and schedule mammogram. [phone]. [place] [place] to see when they want you to come in for Pap smear and pelvic.  Schedule and dental exam.    Preventative Care for Adults - Female      MAINTAIN REGULAR HEALTH EXAMS:  A routine yearly physical is a good way to check in with your primary care provider about your health and preventive screening. It is also an opportunity to share updates about your health and any concerns you have, and receive a thorough all-over exam.   Most health insurance companies pay for at least some preventative services.  Check with your health plan for specific coverages.  WHAT PREVENTATIVE SERVICES DO WOMEN NEED?  Adult women should have their weight and blood pressure checked regularly.   Women age 50 and older should have their cholesterol levels checked regularly.  Women should be screened for cervical cancer with a Pap smear and pelvic exam beginning at either age 50, or 3 years after they become sexually activity.    Breast cancer screening generally begins at age 50 with a mammogram and breast exam by your primary care provider.    Beginning at age 21 and continuing to age 50, women should be screened for colorectal cancer.  Certain people may need continued testing until age 36.  Updating vaccinations is part of preventative care.  Vaccinations help protect against diseases such as the flu.  Osteoporosis is a disease in which the bones lose minerals and strength as we age. Women ages 50 and over should discuss this with their caregivers, as should women after menopause who have other risk factors.  Lab tests are generally done as part of preventative care to screen for anemia and blood disorders, to screen for problems with the kidneys and liver, to screen for bladder problems, to check blood sugar, and to check your cholesterol level.  Preventative services  generally include counseling about diet, exercise, avoiding tobacco, drugs, excessive alcohol consumption, and sexually transmitted infections.    GENERAL RECOMMENDATIONS FOR GOOD HEALTH:  Healthy diet:  Eat a variety of foods, including fruit, vegetables, animal or vegetable protein, such as meat, fish, chicken, and eggs, or beans, lentils, tofu, and grains, such as rice.  Drink plenty of water daily.  Decrease saturated fat in the diet, avoid lots of red meat, processed foods, sweets, fast foods, and fried foods.  Exercise:  Aerobic exercise helps maintain good heart health. At least 30-40 minutes of moderate-intensity exercise is recommended. For example, a brisk walk that increases your heart rate and breathing. This should be done on most days of the week.   Find a type of exercise or a variety of exercises that you enjoy so that it becomes a part of your daily life.  Examples are running, walking, swimming, water aerobics, and biking.  For motivation and support, explore group exercise such as aerobic class, spin class, Zumba, Yoga,or  martial arts, etc.    Set exercise goals for yourself, such as a certain weight goal, walk or run in a race such as a 5k walk/run.  Speak to your primary care provider about exercise goals.  Disease prevention:  If you smoke or chew tobacco, find out from your caregiver how to quit. It can literally save your life, no matter how long you have been a tobacco user. If you do not use tobacco, never begin.   Maintain a healthy diet and normal  weight. Increased weight leads to problems with blood pressure and diabetes.   The Body Mass Index or BMI is a way of measuring how much of your body is fat. Having a BMI above 27 increases the risk of heart disease, diabetes, hypertension, stroke and other problems related to obesity. Your caregiver can help determine your BMI and based on it develop an exercise and dietary program to help you achieve or maintain this  important measurement at a healthful level.  High blood pressure causes heart and blood vessel problems.  Persistent high blood pressure should be treated with medicine if weight loss and exercise do not work.   Fat and cholesterol leaves deposits in your arteries that can block them. This causes heart disease and vessel disease elsewhere in your body.  If your cholesterol is found to be high, or if you have heart disease or certain other medical conditions, then you may need to have your cholesterol monitored frequently and be treated with medication.   Ask if you should have a cardiac stress test if your history suggests this. A stress test is a test done on a treadmill that looks for heart disease. This test can find disease prior to there being a problem.  Menopause can be associated with physical symptoms and risks. Hormone replacement therapy is available to decrease these. You should talk to your caregiver about whether starting or continuing to take hormones is right for you.   Osteoporosis is a disease in which the bones lose minerals and strength as we age. This can result in serious bone fractures. Risk of osteoporosis can be identified using a bone density scan. Women ages 50 and over should discuss this with their caregivers, as should women after menopause who have other risk factors. Ask your caregiver whether you should be taking a calcium supplement and Vitamin D, to reduce the rate of osteoporosis.   Avoid drinking alcohol in excess (more than two drinks per day).  Avoid use of street drugs. Do not share needles with anyone. Ask for professional help if you need assistance or instructions on stopping the use of alcohol, cigarettes, and/or drugs.  Brush your teeth twice a day with fluoride toothpaste, and floss once a day. Good oral hygiene prevents tooth decay and gum disease. The problems can be painful, unattractive, and can cause other health problems. Visit your dentist for a  routine oral and dental check up and preventive care every 6-12 months.   Look at your skin regularly.  Use a mirror to look at your back. Notify your caregivers of changes in moles, especially if there are changes in shapes, colors, a size larger than a pencil eraser, an irregular border, or development of new moles.  Safety:  Use seatbelts 100% of the time, whether driving or as a passenger.  Use safety devices such as hearing protection if you work in environments with loud noise or significant background noise.  Use safety glasses when doing any work that could send debris in to the eyes.  Use a helmet if you ride a bike or motorcycle.  Use appropriate safety gear for contact sports.  Talk to your caregiver about gun safety.  Use sunscreen with a SPF (or skin protection factor) of 15 or greater.  Lighter skinned people are at a greater risk of skin cancer. Don't forget to also wear sunglasses in order to protect your eyes from too much damaging sunlight. Damaging sunlight can accelerate cataract formation.   Practice safe sex.  Use condoms. Condoms are used for birth control and to help reduce the spread of sexually transmitted infections (or STIs).  Some of the STIs are gonorrhea (the clap), chlamydia, syphilis, trichomonas, herpes, HPV (human papilloma virus) and HIV (human immunodeficiency virus) which causes AIDS. The herpes, HIV and HPV are viral illnesses that have no cure. These can result in disability, cancer and death.   Keep carbon monoxide and smoke detectors in your home functioning at all times. Change the batteries every 6 months or use a model that plugs into the wall.   Vaccinations:  Stay up to date with your tetanus shots and other required immunizations. You should have a booster for tetanus every 10 years. Be sure to get your flu shot every year, since 5%-20% of the [place] population comes down with the flu. The flu vaccine changes each year, so being vaccinated once is not  enough. Get your shot in the fall, before the flu season peaks.   Other vaccines to consider:  Human Papilloma Virus or HPV causes cancer of the cervix, and other infections that can be transmitted from person to person. There is a vaccine for HPV, and females should get immunized between the ages of 57 and 65. It requires a series of 3 shots.   Pneumococcal vaccine to protect against certain types of pneumonia.  This is normally recommended for adults age 62 or older.  However, adults younger than 50 years old with certain underlying conditions such as diabetes, heart or lung disease should also receive the vaccine.  Shingles vaccine to protect against Varicella Zoster if you are older than age 76, or younger than 50 years old with certain underlying illness.  Hepatitis A vaccine to protect against a form of infection of the liver by a virus acquired from food.  Hepatitis B vaccine to protect against a form of infection of the liver by a virus acquired from blood or body fluids, particularly if you work in health care.  If you plan to travel internationally, check with your local health department for specific vaccination recommendations.  Cancer Screening:  Breast cancer screening is essential to preventive care for women. All women age 54 and older should perform a breast self-exam every month. At age 4 and older, women should have their caregiver complete a breast exam each year. Women at ages 3 and older should have a mammogram (x-ray film) of the breasts. Your caregiver can discuss how often you need mammograms.    Cervical cancer screening includes taking a Pap smear (sample of cells examined under a microscope) from the cervix (end of the uterus). It also includes testing for HPV (Human Papilloma Virus, which can cause cervical cancer). Screening and a pelvic exam should begin at age 8, or 3 years after a woman becomes sexually active. Screening should occur every year, with a Pap smear  but no HPV testing, up to age 43. After age 57, you should have a Pap smear every 3 years with HPV testing, if no HPV was found previously.   Most routine colon cancer screening begins at the age of 24. On a yearly basis, doctors may provide special easy to use take-home tests to check for hidden blood in the stool. Sigmoidoscopy or colonoscopy can detect the earliest forms of colon cancer and is life saving. These tests use a small camera at the end of a tube to directly examine the colon. Speak to your caregiver about this at age 91, when routine screening begins (  and is repeated every 5 years unless early forms of pre-cancerous polyps or small growths are found).

## 2015-08-25 NOTE — Progress Notes (Signed)
Subjective:    Patient ID: Autumn Robinson, female    DOB: [birth year], 50 y.o.   MRN: [id number]  HPI Chief Complaint  Patient presents with  . fasting cpe    fasting cpe, weight issues,    She is here for a complete physical exam. Last CPE: 2 years ago No concerns or complaints today.  States she has been taking Imuran for crohns for past 4 weeks and states she did not like how this medication made her feel. She will call her gastroenterologist to let him know.  Other providers: Dr. Vertell Limber GI in [facility]- sees him 2 times per year. Remicade every 8 weeks.  Dr. Ellyn Hack- cardiolologist- sees him for palpitations, PSVT- metoprolol prn. once a year.  Gynecologist- [facility]. Has appointment this summer.  Mammogram- 2014- normal.  Pap smear- 2 years ago and always normal per patient IUD 2 years ago- heavy and irregular bleeding   Social history: Lives with husband, works as Software engineer, Denies smoking, drinking alcohol, drug use  Diet: "terrible" eating fast food Excerise: not lately.   Immunizations: pneumonia x 2 in 2014. Tdap 2008  Health maintenance:  Mammogram: did not get this as recommended. Order is in the system.  Colonoscopy: fall 2016. 5 year follow up.  Last Gynecological Exam: 2 years- declines today.  Last Menstrual cycle: last week.  Pregnancies: 2 in past with 2 abortions.  Last Dental Exam: over 2 years Last Eye Exam: January 2017  Wears seatbelt always, uses sunscreen, smoke detectors in home and functioning, does not text while driving and feels safe in home environment.   Reviewed allergies, medications, past medical, surgical, family, and social history.    Review of Systems Review of Systems Constitutional: -fever, -chills, -sweats, -unexpected weight change,-fatigue ENT: -runny nose, -ear pain, -sore throat Cardiology:  -chest pain, -palpitations, -edema Respiratory: -cough, -shortness of breath, -wheezing Gastroenterology:  -abdominal pain, -nausea, -vomiting, -diarrhea, -constipation  Hematology: -bleeding or bruising problems Musculoskeletal: -arthralgias, -myalgias, -joint swelling, -back pain Ophthalmology: -vision changes Urology: -dysuria, -difficulty urinating, -hematuria, -urinary frequency, -urgency Neurology: -headache, -weakness, -tingling, -numbness        Objective:   Physical Exam BP 120/70 mmHg  Pulse 72  Ht 5' 2"  (1.575 m)  Wt 174 lb (78.926 kg)  BMI 31.82 kg/m2  LMP 08/18/2015  General Appearance:    Alert, cooperative, no distress, appears stated age  Head:    Normocephalic, without obvious abnormality, atraumatic  Eyes:    PERRL, conjunctiva/corneas clear, EOM's intact, fundi    benign  Ears:    Normal TM's and external ear canals  Nose:   Nares normal, mucosa normal, no drainage or sinus   tenderness  Throat:   Lips, mucosa, and tongue normal; teeth and gums normal  Neck:   Supple, no lymphadenopathy;  thyroid:  no   enlargement/tenderness/nodules; no carotid   bruit or JVD  Back:    Spine nontender, no curvature, ROM normal, no CVA     tenderness  Lungs:     Clear to auscultation bilaterally without wheezes, rales or     ronchi; respirations unlabored  Chest Wall:    No tenderness or deformity   Heart:    Regular rate and rhythm, S1 and S2 normal, no murmur, rub   or gallop  Breast Exam:    No tenderness, masses, or nipple discharge or inversion.      No axillary lymphadenopathy  Abdomen:     Soft, non-tender, nondistended, normoactive bowel  sounds,    no masses, no hepatosplenomegaly  Genitalia:    Declined and plans to see gynecologist.   Rectal:    Refused.  Extremities:   No clubbing, cyanosis or edema  Pulses:   2+ and symmetric all extremities  Skin:   Skin color, texture, turgor normal, no rashes or lesions  Lymph nodes:   Cervical, supraclavicular, and axillary nodes normal  Neurologic:   CNII-XII intact, normal strength, sensation and gait; reflexes 2+ and  symmetric throughout          Psych:   Normal mood, affect, hygiene and grooming.    Urinalysis dipstick: negative     Assessment & Plan:  Routine general medical examination at a health care facility - Plan: POCT urinalysis dipstick, CBC with Differential/Platelet, Comprehensive metabolic panel, TSH, Lipid panel  Obesity (BMI 30-39.9) - Plan: TSH  HLD (hyperlipidemia) - Plan: Lipid panel  Screening for breast cancer - Plan: MM DIGITAL SCREENING BILATERAL  Obesity and Hyperlipidemia: Discussed eating a low carbohydrate, low fat diet, and low cholesterol diet. Will check lipids. Recommend getting a minimum of 150 minutes of physical activity per week.  Discussed safety and health promotion. Recommend she call and schedule a dental exam. She will call her gynecologist and schedule for Pap smear. She will call and schedule mammogram. Tetanus will be due next year. She declines updating this today.  Follow up in 1 year or sooner pending labs.

## 2015-08-26 LAB — TSH: TSH: 1.46 m[IU]/L

## 2015-09-06 ENCOUNTER — Ambulatory Visit
Admission: RE | Admit: 2015-09-06 | Discharge: 2015-09-06 | Disposition: A | Discharge status: Home / Self Care | Payer: BC Managed Care – PPO | Source: Ambulatory Visit | Attending: Family Medicine | Admitting: Family Medicine

## 2015-09-06 DIAGNOSIS — Z1239 Encounter for other screening for malignant neoplasm of breast: Secondary | ICD-10-CM

## 2015-09-07 ENCOUNTER — Other Ambulatory Visit: Payer: Self-pay | Admitting: Family Medicine

## 2015-09-07 DIAGNOSIS — R928 Other abnormal and inconclusive findings on diagnostic imaging of breast: Secondary | ICD-10-CM

## 2015-09-13 ENCOUNTER — Other Ambulatory Visit: Payer: BC Managed Care – PPO

## 2015-10-13 ENCOUNTER — Encounter: Payer: Self-pay | Admitting: Family Medicine

## 2015-11-04 ENCOUNTER — Ambulatory Visit (INDEPENDENT_AMBULATORY_CARE_PROVIDER_SITE_OTHER): Payer: BC Managed Care – PPO | Admitting: Medical

## 2015-11-04 ENCOUNTER — Encounter: Payer: Self-pay | Admitting: Medical

## 2015-11-04 VITALS — BP 118/62 | HR 107 | Temp 98.2°F | Resp 16 | Wt 179.0 lb

## 2015-11-04 DIAGNOSIS — K50919 Crohn's disease, unspecified, with unspecified complications: Secondary | ICD-10-CM | POA: Diagnosis not present

## 2015-11-04 DIAGNOSIS — Z79899 Other long term (current) drug therapy: Secondary | ICD-10-CM | POA: Diagnosis not present

## 2015-11-04 DIAGNOSIS — J988 Other specified respiratory disorders: Secondary | ICD-10-CM

## 2015-11-04 DIAGNOSIS — R05 Cough: Secondary | ICD-10-CM

## 2015-11-04 DIAGNOSIS — R059 Cough, unspecified: Secondary | ICD-10-CM

## 2015-11-04 MED ORDER — HYDROCODONE-HOMATROPINE 5-1.5 MG/5ML PO SYRP: 5.0000 mL | ORAL_SOLUTION | Freq: Three times a day (TID) | ORAL | 0 refills | Status: DC | PRN

## 2015-11-04 MED ORDER — BENZONATATE 200 MG PO CAPS: 200.0000 mg | ORAL_CAPSULE | Freq: Three times a day (TID) | ORAL | 0 refills | Status: DC | PRN

## 2015-11-04 MED ORDER — LEVOFLOXACIN 500 MG PO TABS: 500.0000 mg | ORAL_TABLET | Freq: Every day | ORAL | 0 refills | Status: DC

## 2015-11-04 NOTE — Patient Instructions (Signed)
Recommendations  Use the Albuterol inhaler, 2 puffs every 4-6 hours for cough spells and wheezing  Use the Tessalon Perle cough drops in the day, up to 3 times per day  Use the Hycodan syrup at night only for worse cough  Sleep elevated on some pillows  Hydrate well with water  Consider Claritin the next 4-5 days in the morning to help with drainage/cough  If you are much worse over the weekend with fever, productive cough, feeling much worse, worse difficulty breathing, then begin antibiotic Levaquin  Call after hours line if concern

## 2015-11-04 NOTE — Progress Notes (Signed)
Subjective: Chief Complaint  Patient presents with  . lingering cough    lingering cough- wants to make sure its not pneumonia or bronchitis.    Here for cough.  Normally sees Harland Dingwall NP here.   Has hx/o Crohn's and remicade and tends to get sick easy.  She has cough that started a little over a week ago.   In the past week has had cough, fever, stuffiness in the head, sore throat, but no ear pain, no NVD.  She has noticed some wheezing when lying flat.  Gets remicade q8 weeks, last one was last Thursday.   Overall improved compared to last week, but now is mainly a lingering cough.  She is a nonsmoker.   No hx/o asthma.  In the past she has been susceptible to infection and pneumonia.    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  No other aggravating or relieving factors. No other complaint.  Past Medical History:  Diagnosis Date  . Allergy    seasonal  . 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 is followed by Dr. Vertell Limber at [facility] in [facility], [place]  . Chest pain 06/05/2011   CT chest 04/2011:  No infiltrate, no effusion, no PE EKG 2013:  No change to suggest pericarditis   . Crohn's disease ([place])    followed by Dr. Lew Dawes for this. visit from 10/12/2015 scanned into chart  . GERD (gastroesophageal reflux disease) 05/01/2011  . Moderate episode of recurrent major depressive disorder ([place]) 12/17/2012  . PSVT (paroxysmal supraventricular tachycardia) ([place])    Diagnosed in [facility] by Dr. Tomie China   Current Outpatient Prescriptions on File Prior to Visit  Medication Sig Dispense Refill  . inFLIXimab (REMICADE) 100 MG injection Inject into the vein.    Marland Kitchen LORazepam (ATIVAN) 1 MG tablet Reported on 06/14/2015    . magnesium oxide (MAG-OX) 400 MG tablet Take 400 mg by mouth daily.    . Naproxen Sodium (ALEVE PO) Take by mouth.    . Potassium 99 MG TABS Take 2 tablets by mouth daily.     . S-Adenosylmethionine (SAM-E) 400 MG TBEC Take 2 tablets by mouth daily.    Marland Kitchen albuterol (PROVENTIL HFA;VENTOLIN HFA) 108 (90 Base) MCG/ACT inhaler Inhale 1 puff into the lungs every 6 (six) hours as needed for wheezing or shortness of breath. Reported on 08/25/2015    . metoprolol tartrate (LOPRESSOR) 25 MG tablet Take 2 tablets (50 mg total) by mouth 2 (two) times daily as needed. (Patient not taking: Reported on 11/04/2015) 30 tablet 11   No current facility-administered medications on file prior to visit.     ROS as in subjective   Objective: BP 118/62   Pulse (!) 107   Temp 98.2 F (36.8 C) (Oral)   Resp 16   Wt 179 lb (81.2 kg)   SpO2 98%   BMI 32.74 kg/m   General appearance: Alert, WD/WN, no distress                             Skin: warm, no rash, no diaphoresis                           Head: no sinus tenderness  Eyes: conjunctiva normal, corneas clear, PERRLA                            Ears: pearly TMs, external ear canals normal                          Nose: septum midline, turbinates swollen, with erythema and clear discharge             Mouth/throat: MMM, tongue normal, mild pharyngeal erythema                           Neck: supple, no adenopathy, no thyromegaly, non tender                          Heart: RRR, normal S1, S2, no murmurs                         Lungs: coarse breath sounds, but no rhonchi, no wheezes, no rales                Extremities: no edema, non tender     Assessment: Encounter Diagnoses  Name Primary?  Marland Kitchen Respiratory tract infection Yes  . Cough   . Crohn's disease with complication, unspecified gastrointestinal tract location ([place])   . High risk medication use     Plan: Symptoms suggest residual cough form respiratory tract infection.   She has important function tomorrow in which she is moderating.  Can use Tessalon Perles in the day, Hycodan syrup QHS, begin Claritin until resolved.   If significantly worse  symptoms this weekend including fever, productive sputum, more ill feeling, then begin Levaquin.  Call/return if worse or not improving.    Terrilynn was seen today for lingering cough.  Diagnoses and all orders for this visit:  Respiratory tract infection  Cough  Crohn's disease with complication, unspecified gastrointestinal tract location [facility])  High risk medication use  Other orders -     levofloxacin (LEVAQUIN) 500 MG tablet; Take 1 tablet (500 mg total) by mouth daily. -     benzonatate (TESSALON) 200 MG capsule; Take 1 capsule (200 mg total) by mouth 3 (three) times daily as needed for cough. -     HYDROcodone-homatropine (HYCODAN) 5-1.5 MG/5ML syrup; Take 5 mLs by mouth every 8 (eight) hours as needed for cough.

## 2015-11-10 ENCOUNTER — Encounter: Payer: Self-pay | Admitting: Internal Medicine

## 2015-12-28 ENCOUNTER — Other Ambulatory Visit (INDEPENDENT_AMBULATORY_CARE_PROVIDER_SITE_OTHER): Payer: BC Managed Care – PPO

## 2015-12-28 DIAGNOSIS — Z111 Encounter for screening for respiratory tuberculosis: Secondary | ICD-10-CM

## 2015-12-30 LAB — TB SKIN TEST
Induration: 0 mm
TB Skin Test: NEGATIVE

## 2016-01-04 ENCOUNTER — Other Ambulatory Visit: Payer: BC Managed Care – PPO

## 2016-04-10 ENCOUNTER — Ambulatory Visit (INDEPENDENT_AMBULATORY_CARE_PROVIDER_SITE_OTHER): Payer: BC Managed Care – PPO | Admitting: Family Medicine

## 2016-04-10 ENCOUNTER — Encounter: Payer: Self-pay | Admitting: Family Medicine

## 2016-04-10 VITALS — BP 118/70 | HR 98 | Wt 185.6 lb

## 2016-04-10 DIAGNOSIS — M79672 Pain in left foot: Secondary | ICD-10-CM

## 2016-04-10 DIAGNOSIS — M79671 Pain in right foot: Secondary | ICD-10-CM

## 2016-04-10 DIAGNOSIS — M2142 Flat foot [pes planus] (acquired), left foot: Secondary | ICD-10-CM

## 2016-04-10 DIAGNOSIS — M2141 Flat foot [pes planus] (acquired), right foot: Secondary | ICD-10-CM

## 2016-04-10 NOTE — Progress Notes (Signed)
   Subjective:    Patient ID: Autumn Robinson, female    DOB: 06-23-1965, 51 y.o.   MRN: [id number]  HPI Chief Complaint  Patient presents with  . foot issues    possible plantar facisitis, wants to make sure she hasn't broken anything   She is here with complaints of bilateral foot pain for the past 5-6 weeks that started after walking a long distance in the [facility] day parade last month. States pain is progressively worsening. Denies injury but reports a history of plantar fascitis. States pain is very similar but much worse than . Pain is worse in the morning and later in the day after sitting at her job. States her heels are severely painful and the soles of her feet feel hot and inflamed sometimes. Pain is starting to radiate to the tops of her feet.  States she has been taking advil 4 tabs twice daily. She has changed her shoes and is wearing supportive shoes. Has tried stretching and massage and nothing seems to be helping.  She questions if her symptoms could be related to Crohns disease. She is getting Remicade injections. Has had recent labs.  Denies fever, chills, eye issues or vision changes, chest pain, shortness of breath, cough, abdominal pain, GI or GU symptoms.  No other joint pain or inflammation.   Reviewed allergies, medications, past medical, surgical, and social history.   Review of Systems Pertinent positives and negatives in the history of present illness.     Objective:   Physical Exam  Constitutional: She appears well-developed and well-nourished. No distress.  Eyes: Conjunctivae are normal. Pupils are equal, round, and reactive to light.  Neck: Normal range of motion. Neck supple.  Musculoskeletal:       Right foot: Normal. There is normal range of motion, no tenderness, no bony tenderness, no swelling and normal capillary refill.       Left foot: Normal. There is normal range of motion, no tenderness, no bony tenderness, no swelling and normal capillary refill.    BP 118/70   Pulse 98   Wt 185 lb 9.6 oz (84.2 kg)   BMI 33.95 kg/m       Assessment & Plan:  Bilateral foot pain - Plan: Ambulatory referral to Podiatry  Pes planus of both feet - Plan: Ambulatory referral to Podiatry  Discussed that her symptoms have been ongoing for too long without any improvement with conservative therapy. Plan to refer her to podiatry for further evaluation and treatment. Discussed that she is not systemically ill and I am not highly concerned that this is linked to her underlying crohns disease. Discussed that her foot exam is relatively unremarkable.  Reviewed recent labs done for her Remicade treatment.

## 2016-04-20 ENCOUNTER — Ambulatory Visit: Payer: BC Managed Care – PPO | Admitting: Podiatry

## 2016-04-30 ENCOUNTER — Ambulatory Visit: Payer: BC Managed Care – PPO | Admitting: Cardiology

## 2016-05-10 ENCOUNTER — Encounter: Payer: Self-pay | Admitting: Cardiology

## 2016-05-10 ENCOUNTER — Ambulatory Visit (INDEPENDENT_AMBULATORY_CARE_PROVIDER_SITE_OTHER): Payer: BC Managed Care – PPO | Admitting: Cardiology

## 2016-05-10 VITALS — BP 126/87 | HR 72 | Ht 62.0 in | Wt 196.0 lb

## 2016-05-10 DIAGNOSIS — I471 Supraventricular tachycardia: Secondary | ICD-10-CM

## 2016-05-10 DIAGNOSIS — E669 Obesity, unspecified: Secondary | ICD-10-CM | POA: Diagnosis not present

## 2016-05-10 MED ORDER — METOPROLOL TARTRATE 25 MG PO TABS: 50.0000 mg | ORAL_TABLET | Freq: Two times a day (BID) | ORAL | 11 refills | Status: DC | PRN

## 2016-05-10 NOTE — Progress Notes (Signed)
PCP: Harland Dingwall, NP-C  Clinic Note: Chief Complaint  Patient presents with  . Follow-up    1 year; Pt sates no Sx.  - h/o PSVT now controlled    HPI: Autumn Robinson is a 51 y.o. female with a PMH below who presents today for Annual follow-up for her PSVT.  Autumn Robinson was last seen In March 2017. She was doing quite well at that time. Only one episode that occurred and it sort of broke with vagal episode.  Recent Hospitalizations: None  Studies Reviewed: None  Interval History: Autumn Robinson presents today doing fine from a cardiac standpoint. She says that she's had few potential episodes of PSVT that of all broken with vagal maneuvers. Usually the carotid massage works best for her. Nothing is lasted more than a minute. She otherwise has not had any chest tightness pressure with rest or exertion. No syncope or near-syncope. No PND, orthopnea or edema.   No lightheadedness, dizziness, weakness or syncope/near syncope. No TIA/amaurosis fugax symptoms. No claudication.  Her major issue over the last year that she's gained quite a bit overweight (30 pounds) and has been dealing a lot with stress and some work-related depression/dysthymia. She just has been noting lack of motivation of legs to make sure that having PSVT would not potentially be a cause for this symptom.  ROS: A comprehensive was performed. Review of Systems  Constitutional: Positive for malaise/fatigue (More anhedonia and lack of motivation).  HENT: Negative for nosebleeds.   Respiratory: Negative.   Gastrointestinal: Negative for blood in stool and melena.  Genitourinary: Negative for frequency and hematuria.  Musculoskeletal: Negative for joint pain and myalgias.  Neurological: Negative.   Psychiatric/Behavioral: Positive for depression. The patient is nervous/anxious.        Per history of present illness  All other systems reviewed and are negative.  Past Medical History:  Diagnosis Date  . Allergy    seasonal   . 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 is followed by Dr. Vertell Limber at [facility] in [facility], [place]  . Chest pain 06/05/2011   CT chest 04/2011:  No infiltrate, no effusion, no PE EKG 2013:  No change to suggest pericarditis   . Crohn's disease ([place])    followed by Dr. Lew Dawes for this. visit from 10/12/2015 scanned into chart  . GERD (gastroesophageal reflux disease) 05/01/2011  . Moderate episode of recurrent major depressive disorder ([place]) 12/17/2012  . PSVT (paroxysmal supraventricular tachycardia) ([place])    Diagnosed in [facility] by Dr. Tomie China    Past Surgical History:  Procedure Laterality Date  . [place], and again 8 years later   multiple perianal abcesses, several surgeries  . Cardiac Event Monitor  October-November 2014   Demonstrated PSVT  . TRANSTHORACIC ECHOCARDIOGRAM  October 2014   Normal LV size and function, EF 60-65%. No regional wall motion abnormalities. No notable valvular lesions. No effusion.    Current Meds  Medication Sig  . Ibuprofen (ADVIL PO) Take by mouth.  . inFLIXimab (REMICADE) 100 MG injection Inject into the vein.  Marland Kitchen LORazepam (ATIVAN) 1 MG tablet Reported on 06/14/2015  . magnesium oxide (MAG-OX) 400 MG tablet Take 400 mg by mouth daily.  . metoprolol tartrate (LOPRESSOR) 25 MG tablet Take 2 tablets (50 mg total) by mouth 2 (two) times daily as needed.  . Naproxen Sodium (ALEVE PO) Take by mouth.  . Potassium 99 MG TABS Take 2 tablets by mouth  daily.  . S-Adenosylmethionine (SAM-E) 400 MG TBEC Take 2 tablets by mouth daily.  . [DISCONTINUED] albuterol (PROVENTIL HFA;VENTOLIN HFA) 108 (90 Base) MCG/ACT inhaler Inhale 1 puff into the lungs every 6 (six) hours as needed for wheezing or shortness of breath. Reported on 08/25/2015  . [DISCONTINUED] metoprolol tartrate (LOPRESSOR) 25 MG tablet Take 2 tablets (50 mg total) by mouth 2 (two) times daily as needed.    Allergies  Allergen Reactions  .  Sulfa Antibiotics Nausea Only and Other (See Comments)    Exhaustion    Social History   Social History  . Marital status: Married    Spouse name: N/A  . Number of children: N/A  . Years of education: N/A   Social History Main Topics  . Smoking status: Former Smoker    Packs/day: 1.00    Years: 25.00    Types: Cigarettes    Start date: 08/03/1988  . Smokeless tobacco: Never Used  . Alcohol use No  . Drug use: No  . Sexual activity: Yes    Partners: Male    Birth control/ protection: IUD     Comment: paragard   Other Topics Concern  . 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 She has a graduate school agree to. Smokes about one pack a day. Rare alcohol consumption. She uses and for anxiety around her apartment for medication infusion for her Crohn's.          family history includes Allergies in her father and mother; Bone cancer in her maternal grandfather; Cancer in her maternal grandfather and maternal grandmother; Crohn's disease in her sister; Diabetes in her father; Esophageal cancer in her maternal grandmother; GER disease in her mother; Heart disease in her paternal grandfather; Hiatal hernia in her mother; Hypertension in her father and mother; Macular degeneration in her maternal grandmother; Stroke in her paternal grandfather.  Wt Readings from Last 3 Encounters:  05/10/16 88.9 kg (196 lb)  04/10/16 84.2 kg (185 lb 9.6 oz)  11/04/15 81.2 kg (179 lb)  04/2015 - 165 lb  PHYSICAL EXAM BP 126/87   Pulse 72   Ht 5' 2"  (1.575 m)   Wt 88.9 kg (196 lb)   BMI 35.85 kg/m  General appearance: alert, cooperative, appears stated age, no distress and Now moderately obese HEENT: [place]/AT, EOMI, MMM, anicteric sclera Neck: no adenopathy, no carotid bruit and no JVD Lungs: clear to auscultation bilaterally, normal percussion bilaterally and non-labored Heart: regular rate and rhythm, S1 and S2 normal, no  murmur, click, rub or gallop; nondisplaced PMI Abdomen: soft, non-tender; bowel sounds normal; no masses,  no organomegaly; no HJR Extremities: extremities normal, atraumatic, no cyanosis, or edema  Pulses: 2+ and symmetric;  Skin: mobility and turgor normal, no edema, no evidence of bleeding or bruising and no lesions noted Neurologic: Mental status: Alert, oriented, thought content appropriate   Adult ECG Report  Rate: 77 ;  Rhythm: normal sinus rhythm and Normal axis, intervals and durations;   Narrative Interpretation: Normal EKG   Other studies Reviewed: Additional studies/ records that were reviewed today include:  Recent Labs:   Lab Results  Component Value Date   CREATININE 0.62 08/25/2015   BUN 5 (L) 08/25/2015   NA 131 (L) 08/25/2015   K 4.7 08/25/2015   CL 97 (L) 08/25/2015   CO2 27 08/25/2015     ASSESSMENT / PLAN: Problem List Items Addressed This  Visit    Obesity (BMI 30-39.9) (Chronic)    Unfortunately, she has gained weight and I think this may be more related to her depression symptoms. We talked a little bit about trying to just make sure she gets back To exercise as this will help her. I think she needs to talk to her primary care provider about potentially being treated for her anxiety and depression.      PSVT (paroxysmal supraventricular tachycardia) (HCC) - Primary (Chronic)    Well-controlled. Only using when necessary metoprolol. I think she may be used it once or twice a month over the last year. Usually able to break her episodes with vagal maneuvers. We just reviewed her treatment plan and no changes.      Relevant Medications   metoprolol tartrate (LOPRESSOR) 25 MG tablet   Other Relevant Orders   EKG 12-Lead (Completed)      Current medicines are reviewed at length with the patient today. (+/- concerns) n/a The following changes have been made: n/a  Patient Instructions  Medication Instructions: Your physician recommends that you  continue on your current medications as directed. Please refer to the Current Medication list given to you today.   Follow-Up: Your physician wants you to follow-up in: 1 year with Dr. Ellyn Hack. You will receive a reminder letter in the mail two months in advance. If you don't receive a letter, please call our office to schedule the follow-up appointment.  If you need a refill on your cardiac medications before your next appointment, please call your pharmacy.    Studies Ordered:   Orders Placed This Encounter  Procedures  . EKG 12-Lead      Glenetta Hew, M.D., M.S. Interventional Cardiologist   Pager # [phone] Phone # [phone] [address] [place],  [address]

## 2016-05-10 NOTE — Patient Instructions (Signed)
Medication Instructions: Your physician recommends that you continue on your current medications as directed. Please refer to the Current Medication list given to you today.   Follow-Up: Your physician wants you to follow-up in: 1 year with Dr. Ellyn Hack. You will receive a reminder letter in the mail two months in advance. If you don't receive a letter, please call our office to schedule the follow-up appointment.  If you need a refill on your cardiac medications before your next appointment, please call your pharmacy.

## 2016-05-12 ENCOUNTER — Encounter: Payer: Self-pay | Admitting: Cardiology

## 2016-05-12 NOTE — Assessment & Plan Note (Signed)
Well-controlled. Only using when necessary metoprolol. I think she may be used it once or twice a month over the last year. Usually able to break her episodes with vagal maneuvers. We just reviewed her treatment plan and no changes.

## 2016-05-12 NOTE — Assessment & Plan Note (Signed)
Unfortunately, she has gained weight and I think this may be more related to her depression symptoms. We talked a little bit about trying to just make sure she gets back To exercise as this will help her. I think she needs to talk to her primary care provider about potentially being treated for her anxiety and depression.

## 2016-07-11 ENCOUNTER — Encounter: Payer: Self-pay | Admitting: Family Medicine

## 2016-07-11 ENCOUNTER — Ambulatory Visit (INDEPENDENT_AMBULATORY_CARE_PROVIDER_SITE_OTHER): Payer: BC Managed Care – PPO | Admitting: Family Medicine

## 2016-07-11 VITALS — BP 120/70 | HR 97 | Temp 98.4°F | Wt 196.6 lb

## 2016-07-11 DIAGNOSIS — M791 Myalgia, unspecified site: Secondary | ICD-10-CM

## 2016-07-11 DIAGNOSIS — Z9189 Other specified personal risk factors, not elsewhere classified: Secondary | ICD-10-CM

## 2016-07-11 DIAGNOSIS — R631 Polydipsia: Secondary | ICD-10-CM | POA: Diagnosis not present

## 2016-07-11 DIAGNOSIS — G4719 Other hypersomnia: Secondary | ICD-10-CM

## 2016-07-11 DIAGNOSIS — R0683 Snoring: Secondary | ICD-10-CM

## 2016-07-11 DIAGNOSIS — R5383 Other fatigue: Secondary | ICD-10-CM | POA: Diagnosis not present

## 2016-07-11 DIAGNOSIS — R350 Frequency of micturition: Secondary | ICD-10-CM

## 2016-07-11 DIAGNOSIS — E871 Hypo-osmolality and hyponatremia: Secondary | ICD-10-CM | POA: Diagnosis not present

## 2016-07-11 LAB — POCT URINALYSIS DIPSTICK
Bilirubin, UA: NEGATIVE
Blood, UA: NEGATIVE
Glucose, UA: NEGATIVE
KETONES UA: NEGATIVE
Leukocytes, UA: NEGATIVE
Nitrite, UA: NEGATIVE
PH UA: 6 (ref 5.0–8.0)
PROTEIN UA: NEGATIVE
SPEC GRAV UA: 1.015 (ref 1.010–1.025)
UROBILINOGEN UA: NEGATIVE U/dL — AB

## 2016-07-11 LAB — COMPLETE METABOLIC PANEL WITH GFR
ALBUMIN: 4.2 g/dL (ref 3.6–5.1)
ALK PHOS: 83 U/L (ref 33–130)
ALT: 46 U/L — ABNORMAL HIGH (ref 6–29)
AST: 31 U/L (ref 10–35)
BILIRUBIN TOTAL: 0.4 mg/dL (ref 0.2–1.2)
BUN: 8 mg/dL (ref 7–25)
CALCIUM: 9.4 mg/dL (ref 8.6–10.4)
CO2: 22 mmol/L (ref 20–31)
Chloride: 101 mmol/L (ref 98–110)
Creat: 0.79 mg/dL (ref 0.50–1.05)
GFR, EST NON AFRICAN AMERICAN: 88 mL/min (ref >=60)
GFR, Est African American: >89 mL/min (ref >=60)
GLUCOSE: 83 mg/dL (ref 65–99)
POTASSIUM: 4.8 mmol/L (ref 3.5–5.3)
SODIUM: 134 mmol/L — AB (ref 135–146)
TOTAL PROTEIN: 7.5 g/dL (ref 6.1–8.1)

## 2016-07-11 LAB — CBC WITH DIFFERENTIAL/PLATELET
BASOS ABS: 0 {cells}/uL (ref 0–200)
Basophils Relative: 0 %
EOS PCT: 5 %
Eosinophils Absolute: 470 cells/uL (ref 15–500)
HCT: 41.4 % (ref 35.0–45.0)
Hemoglobin: 13.9 g/dL (ref 11.7–15.5)
LYMPHS ABS: 3290 {cells}/uL (ref 850–3900)
Lymphocytes Relative: 35 %
MCH: 32.6 pg (ref 27.0–33.0)
MCHC: 33.6 g/dL (ref 32.0–36.0)
MCV: 97 fL (ref 80.0–100.0)
MONOS PCT: 6 %
MPV: 9.1 fL (ref 7.5–12.5)
Monocytes Absolute: 564 cells/uL (ref 200–950)
NEUTROS ABS: 5076 {cells}/uL (ref 1500–7800)
NEUTROS PCT: 54 %
PLATELETS: 289 10*3/uL (ref 140–400)
RBC: 4.27 MIL/uL (ref 3.80–5.10)
RDW: 13.3 % (ref 11.0–15.0)
WBC: 9.4 10*3/uL (ref 4.0–10.5)

## 2016-07-11 NOTE — Patient Instructions (Signed)
Call and schedule an appointment at the breast center for further evaluation.  [phone]  Call and schedule an appointment with your therapist.   We will call you with your lab results.   Hyponatremia Hyponatremia is when the amount of salt (sodium) in your blood is too low. When sodium levels are low, your cells absorb extra water and they swell. The swelling happens throughout the body, but it mostly affects the brain. What are the causes? This condition may be caused by:  Heart, kidney, or liver problems.  Thyroid problems.  Adrenal gland problems.  Metabolic conditions, such as syndrome of inappropriate antidiuretic hormone (SIADH).  Severe vomiting and diarrhea.  Certain medicines or illegal drugs.  Dehydration.  Drinking too much water.  Eating a diet that is low in sodium.  Large burns on your body.  Sweating. What increases the risk? This condition is more likely to develop in people who:  Have long-term (chronic) kidney disease.  Have heart failure.  Have a medical condition that causes frequent or excessive diarrhea.  Have metabolic conditions, such as Addison disease or SIADH.  Take certain medicines that affect the sodium and fluid balance in the blood. Some of these medicine types include:  Diuretics.  NSAIDs.  Some opioid pain medicines.  Some antidepressants.  Some seizure prevention medicines. What are the signs or symptoms? Symptoms of this condition include:  Nausea and vomiting.  Confusion.  Lethargy.  Agitation.  Headache.  Seizures.  Unconsciousness.  Appetite loss.  Muscle weakness and cramping.  Feeling weak or light-headed.  Having a rapid heart rate.  Fainting, in severe cases. How is this diagnosed? This condition is diagnosed with a medical history and physical exam. You will also have other tests, including:  Blood tests.  Urine tests. How is this treated? Treatment for this condition depends on the  cause. Treatment may include:  Fluids given through an IV tube that is inserted into one of your veins.  Medicines to correct the sodium imbalance. If medicines are causing the condition, the medicines will need to be adjusted.  Limiting water or fluid intake to get the correct sodium balance. Follow these instructions at home:  Take medicines only as directed by your health care provider. Many medicines can make this condition worse. Talk with your health care provider about any medicines that you are currently taking.  Carefully follow a recommended diet as directed by your health care provider.  Carefully follow instructions from your health care provider about fluid restrictions.  Keep all follow-up visits as directed by your health care provider. This is important.  Do not drink alcohol. Contact a health care provider if:  You develop worsening nausea, fatigue, headache, confusion, or weakness.  Your symptoms go away and then return.  You have problems following the recommended diet. Get help right away if:  You have a seizure.  You faint.  You have ongoing diarrhea or vomiting. This information is not intended to replace advice given to you by your health care provider. Make sure you discuss any questions you have with your health care provider. Document Released: 01/26/2002 Document Revised: 07/14/2015 Document Reviewed: 02/25/2014 Elsevier Interactive Patient Education  2017 [employer].

## 2016-07-11 NOTE — Progress Notes (Signed)
Subjective:    Patient ID: Autumn Robinson, female    DOB: 03/22/1965, 51 y.o.   MRN: [id number]  HPI Chief Complaint  Patient presents with  . multiple issues    multiple issues- low sodium last 2 visits- eats lots of salt and drinking gatorade now, muscle aches for 2 months and fatigue-(sleeping all the time) for last 3 months   She is here with multiple complaints. Complains of worsening fatigue and hyponatremia. States she has a 3 month history of muscle aches and generalized muscle weakness. States her muscles fatigue quickly.  She also reports hot flashes and chills.   States she has also gained 30 lbs over a 4 month period. Reports having increased thirst, this is ongoing. States she has been drinking 6-8 17 ounces of water daily for months. She has reduced the amount to 4 bottles per day x 1 week since she was told by her GI that her sodium was low. She has been drinking gatorade 40 ounces for the past week.   Sates her muscle aches have been so bad that she has layed on her office floor and cried. States she thinks the gatorade is helping and her myalgias are improving.    Reports frequent urination- going 5 times per day and once at night.  States she has been feeling confused. Left her keys in her office and states this is not like her. She became tearful.   Labs are her GI showed normal renal function.  She also had normal CRP, vitamin B12, CBC and iron studies. States her GI is concerned that her fatigue may be related to her crohn's disease and started her on 15 mg of methotrexate per week.  She is due to see them back and for more blood work in 3 months.   LFT mildly elevated.  Rarely drinks alcohol.  States her diet is fairly high in sodium.  Does not exercise.   Family history of diabetes, father has type 2.    Recently saw cardiologist and no sign of CHF.   States she has felt depressed and anxious. States she has history of anxiety and depresion. Has not seen her  therapist in years.  Takes Sam-E for this.    States she has been sleeping too much and always feels tired. Questions whether she may have sleep apnea. States she snores loudly and wakes herself up gasping for air like she has stopped breathing.   She works at [employer] at [employer] and has a Designer, multimedia.   Denies fever, dizziness, headaches, vision changes, chest pain, palpitations, abdominal pain, vaginal discharge.  No rash.   Has IUD- due to be removed in 2 years.    Depression screen [facility] 2/9 07/11/2016 03/19/2015  Decreased Interest 1 0  Down, Depressed, Hopeless 0 0  PHQ - 2 Score 1 0  Altered sleeping 3 -  Tired, decreased energy 3 -  Change in appetite 3 -  Feeling bad or failure about yourself  2 -  Trouble concentrating 2 -  Moving slowly or fidgety/restless 0 -  Suicidal thoughts 0 -  PHQ-9 Score 14 -     Review of Systems Pertinent positives and negatives in the history of present illness.     Objective:   Physical Exam  Constitutional: She is oriented to person, place, and time. She appears well-developed and well-nourished. No distress.  HENT:  Mouth/Throat: Oropharynx is clear and moist.  Eyes: Conjunctivae and EOM are normal. Pupils are  equal, round, and reactive to light. No scleral icterus.  Neck: Normal range of motion. Neck supple. No JVD present. No thyromegaly present.  Cardiovascular: Normal rate, regular rhythm and normal heart sounds.  Exam reveals no gallop and no friction rub.   No murmur heard. Pulmonary/Chest: Breath sounds normal.  Musculoskeletal: Normal range of motion. She exhibits no edema.  Lymphadenopathy:    She has no cervical adenopathy.  Neurological: She is alert and oriented to person, place, and time. She has normal strength and normal reflexes. No cranial nerve deficit or sensory deficit. Coordination and gait normal.  Skin: Skin is warm and dry. No rash noted. No pallor.  Psychiatric: She has a normal mood and affect. Her speech is  normal and behavior is normal. Judgment and thought content normal. Cognition and memory are normal.   BP 120/70   Pulse 97   Temp 98.4 F (36.9 C) (Oral)   Wt 196 lb 9.6 oz (89.2 kg)   SpO2 98%   BMI 35.96 kg/m       Assessment & Plan:  Chronic hyponatremia - Plan: COMPLETE METABOLIC PANEL WITH GFR, CBC with Differential/Platelet, TSH, Osmolality, Osmolality, urine  Fatigue, unspecified type - Plan: COMPLETE METABOLIC PANEL WITH GFR, CBC with Differential/Platelet, TSH, VITAMIN D 25 Hydroxy (Vit-D Deficiency, Fractures), Osmolality, Osmolality, urine  Excessive daytime sleepiness  Myalgia  At risk for sleep apnea - Plan: Split night study  Snoring - Plan: Split night study  Increased thirst - Plan: Hemoglobin A1c, Osmolality, Osmolality, urine  Urinary frequency - Plan: POCT urinalysis dipstick, Hemoglobin A1c  Discussed possible etiologies for hyponatremia. Review of her labs shows that she has had hyponatremia for at least 6 years however it does seem that her serum sodium is lower. She is now symptomatic with fatigue and myalgias. Her neuro exam is benign. She is euvolemic.  She does appear to be at an increased risk of sleep apnea based on snoring, excessive daytime sleepiness, and suspicious periods of apnea. Her epworth score is 12. Will send her for a sleep study.  Moderate depression according to PHQ9- she will call and schedule with her therapist.  No medication recommendation at this point until lab results are back.  UA- spec grav 1.015, neg. No sign of UTI. Will check for diabetes due to increased thirst and urinary frequency.  Follow up pending labs.  Spent a minimum of 40 minutes face to face with patient and at least 50% was in counseling and coordination of care.

## 2016-07-12 ENCOUNTER — Encounter: Payer: Self-pay | Admitting: Family Medicine

## 2016-07-12 ENCOUNTER — Other Ambulatory Visit: Payer: Self-pay | Admitting: Family Medicine

## 2016-07-12 DIAGNOSIS — E559 Vitamin D deficiency, unspecified: Secondary | ICD-10-CM

## 2016-07-12 LAB — OSMOLALITY: OSMOLALITY: 278 mosm/kg (ref 278–305)

## 2016-07-12 LAB — HEMOGLOBIN A1C
HEMOGLOBIN A1C: 5.2 % (ref <5.7)
Mean Plasma Glucose: 103 mg/dL

## 2016-07-12 LAB — VITAMIN D 25 HYDROXY (VIT D DEFICIENCY, FRACTURES): Vit D, 25-Hydroxy: 12 ng/mL — ABNORMAL LOW (ref 30–100)

## 2016-07-12 LAB — TSH: TSH: 2.16 m[IU]/L

## 2016-07-12 LAB — OSMOLALITY, URINE: OSMOLALITY UR: 315 mosm/kg (ref 50–1200)

## 2016-07-12 MED ORDER — VITAMIN D (ERGOCALCIFEROL) 1.25 MG (50000 UNIT) PO CAPS: 50000.0000 [IU] | ORAL_CAPSULE | ORAL | 0 refills | Status: DC

## 2016-07-28 ENCOUNTER — Other Ambulatory Visit: Payer: Self-pay | Admitting: Family Medicine

## 2016-07-30 NOTE — Telephone Encounter (Signed)
This rx is too soon.  Refilled med to on 5/24 for 8 weeks. Pt was advised to come back in after 8 weeks and have a repeat lab. Denying rx

## 2016-08-06 ENCOUNTER — Telehealth: Payer: Self-pay | Admitting: Family Medicine

## 2016-08-06 NOTE — Telephone Encounter (Signed)
Faxed back that this is too early

## 2016-08-06 NOTE — Telephone Encounter (Signed)
Fax refill request  Vitamin D cap 50000 unt #8 Last filled 07/12/16

## 2016-08-24 ENCOUNTER — Ambulatory Visit (HOSPITAL_BASED_OUTPATIENT_CLINIC_OR_DEPARTMENT_OTHER): Payer: BC Managed Care – PPO | Attending: Family Medicine | Admitting: Internal Medicine

## 2016-08-24 VITALS — Ht 62.0 in | Wt 190.0 lb

## 2016-08-24 DIAGNOSIS — R0683 Snoring: Secondary | ICD-10-CM | POA: Diagnosis present

## 2016-08-24 DIAGNOSIS — G4736 Sleep related hypoventilation in conditions classified elsewhere: Secondary | ICD-10-CM | POA: Diagnosis not present

## 2016-08-24 DIAGNOSIS — G4739 Other sleep apnea: Secondary | ICD-10-CM

## 2016-08-24 DIAGNOSIS — G4733 Obstructive sleep apnea (adult) (pediatric): Secondary | ICD-10-CM | POA: Diagnosis not present

## 2016-08-27 ENCOUNTER — Other Ambulatory Visit (HOSPITAL_BASED_OUTPATIENT_CLINIC_OR_DEPARTMENT_OTHER): Payer: Self-pay

## 2016-08-27 DIAGNOSIS — Z9189 Other specified personal risk factors, not elsewhere classified: Secondary | ICD-10-CM

## 2016-08-27 DIAGNOSIS — R0683 Snoring: Secondary | ICD-10-CM

## 2016-09-01 ENCOUNTER — Other Ambulatory Visit: Payer: Self-pay | Admitting: Family Medicine

## 2016-09-01 DIAGNOSIS — G4739 Other sleep apnea: Secondary | ICD-10-CM | POA: Diagnosis not present

## 2016-09-01 NOTE — Procedures (Signed)
   Patient Name: Autumn Robinson, Autumn Robinson Date: 08/24/2016 Gender: Female D.O.B: 1966-02-17 Age (years): 33 Referring Provider: Girtha Rm NP Height (inches): 62 Interpreting Physician: Baird Lyons MD, ABSM Weight (lbs): 190 RPSGT: Jorge Ny BMI: 35 MRN: [id number] Neck Size: 15.00 CLINICAL INFORMATION Sleep Study Type: NPSG  Indication for sleep study: Snoring  Epworth Sleepiness Score: 11  SLEEP STUDY TECHNIQUE As per the AASM Manual for the Scoring of Sleep and Associated Events v2.3 (April 2016) with a hypopnea requiring 4% desaturations.  The channels recorded and monitored were frontal, central and occipital EEG, electrooculogram (EOG), submentalis EMG (chin), nasal and oral airflow, thoracic and abdominal wall motion, anterior tibialis EMG, snore microphone, electrocardiogram, and pulse oximetry.  MEDICATIONS Medications self-administered by patient taken the night of the study : none reported  SLEEP ARCHITECTURE The study was initiated at 10:32:34 PM and ended at 5:14:58 AM.  Sleep onset time was 3.9 minutes and the sleep efficiency was 91.9%. The total sleep time was 370.0 minutes.  Stage REM latency was 73.0 minutes.  The patient spent 8.51% of the night in stage N1 sleep, 62.97% in stage N2 sleep, 0.00% in stage N3 and 28.51% in REM.  Alpha intrusion was absent.  Supine sleep was 48.36%.  RESPIRATORY PARAMETERS The overall apnea/hypopnea index (AHI) was 7.5 per hour. There were 30 total apneas, including 29 obstructive, 1 central and 0 mixed apneas. There were 16 hypopneas and 9 RERAs.  The AHI during Stage REM sleep was 21.6 per hour.  AHI while supine was 13.4 per hour.  The mean oxygen saturation was 93.66%. The minimum SpO2 during sleep was 76.00%.  Moderate snoring was noted during this study.  CARDIAC DATA The 2 lead EKG demonstrated sinus rhythm. The mean heart rate was 68.56 beats per minute. Other EKG findings include: None.  LEG  MOVEMENT DATA The total PLMS were 9 with a resulting PLMS index of 1.46. Associated arousal with leg movement index was 0.0 .  IMPRESSIONS - Mild obstructive sleep apnea occurred during this study (AHI = 7.5/h). - There were insufficient early events to meet protocol requirements for split CPAP titration. - No significant central sleep apnea occurred during this study (CAI = 0.2/h). - Moderate oxygen desaturation was noted during this study (Min O2 = 76.00%, Mean 93.6%). - The patient snored with Moderate snoring volume. - No cardiac abnormalities were noted during this study. - Clinically significant periodic limb movements did not occur during sleep. No significant associated arousals.  DIAGNOSIS - Obstructive Sleep Apnea (327.23 [G47.33 ICD-10]) - Nocturnal Hypoxemia (327.26 [G47.36 ICD-10])  RECOMMENDATIONS - Positional therapy avoiding supine position during sleep. - Very mild obstructive sleep apnea. If choosing to treat, common measures may include chin strap, oral appliance, CPAP or ENT surgical consideration. - Avoid alcohol, sedatives and other CNS depressants that may worsen sleep apnea and disrupt normal sleep architecture. - Sleep hygiene should be reviewed to assess factors that may improve sleep quality. - Weight management and regular exercise should be initiated or continued if appropriate.  [Electronically signed] 09/01/2016 11:44 AM  Baird Lyons MD, ABSM Diplomate, American Board of Sleep Medicine   NPI: [id number]  [place], American Board of Sleep Medicine  ELECTRONICALLY SIGNED ON:  09/01/2016, 11:40 AM [place] PH: [phone]   FX: [phone] [place]

## 2016-09-05 ENCOUNTER — Encounter: Payer: Self-pay | Admitting: Family Medicine

## 2016-09-05 ENCOUNTER — Telehealth: Payer: Self-pay | Admitting: Internal Medicine

## 2016-09-05 ENCOUNTER — Ambulatory Visit (INDEPENDENT_AMBULATORY_CARE_PROVIDER_SITE_OTHER): Payer: BC Managed Care – PPO | Admitting: Family Medicine

## 2016-09-05 VITALS — BP 120/80 | HR 80 | Wt 198.8 lb

## 2016-09-05 DIAGNOSIS — R5383 Other fatigue: Secondary | ICD-10-CM | POA: Diagnosis not present

## 2016-09-05 DIAGNOSIS — R631 Polydipsia: Secondary | ICD-10-CM

## 2016-09-05 DIAGNOSIS — R922 Inconclusive mammogram: Secondary | ICD-10-CM | POA: Diagnosis not present

## 2016-09-05 DIAGNOSIS — R740 Nonspecific elevation of levels of transaminase and lactic acid dehydrogenase [LDH]: Secondary | ICD-10-CM

## 2016-09-05 DIAGNOSIS — E559 Vitamin D deficiency, unspecified: Secondary | ICD-10-CM

## 2016-09-05 DIAGNOSIS — R7401 Elevation of levels of liver transaminase levels: Secondary | ICD-10-CM

## 2016-09-05 DIAGNOSIS — E871 Hypo-osmolality and hyponatremia: Secondary | ICD-10-CM | POA: Diagnosis not present

## 2016-09-05 DIAGNOSIS — G4733 Obstructive sleep apnea (adult) (pediatric): Secondary | ICD-10-CM

## 2016-09-05 LAB — CBC WITH DIFFERENTIAL/PLATELET
BASOS ABS: 0 {cells}/uL (ref 0–200)
Basophils Relative: 0 %
EOS PCT: 3 %
Eosinophils Absolute: 324 cells/uL (ref 15–500)
HCT: 41.4 % (ref 35.0–45.0)
HEMOGLOBIN: 14.5 g/dL (ref 11.7–15.5)
LYMPHS ABS: 3348 {cells}/uL (ref 850–3900)
Lymphocytes Relative: 31 %
MCH: 34 pg — AB (ref 27.0–33.0)
MCHC: 35 g/dL (ref 32.0–36.0)
MCV: 97 fL (ref 80.0–100.0)
MPV: 9.5 fL (ref 7.5–12.5)
Monocytes Absolute: 864 cells/uL (ref 200–950)
Monocytes Relative: 8 %
NEUTROS ABS: 6264 {cells}/uL (ref 1500–7800)
Neutrophils Relative %: 58 %
Platelets: 300 10*3/uL (ref 140–400)
RBC: 4.27 MIL/uL (ref 3.80–5.10)
RDW: 13.9 % (ref 11.0–15.0)
WBC: 10.8 10*3/uL — ABNORMAL HIGH (ref 4.0–10.5)

## 2016-09-05 LAB — COMPLETE METABOLIC PANEL WITH GFR
ALBUMIN: 4.2 g/dL (ref 3.6–5.1)
ALK PHOS: 86 U/L (ref 33–130)
ALT: 32 U/L — ABNORMAL HIGH (ref 6–29)
AST: 22 U/L (ref 10–35)
BILIRUBIN TOTAL: 0.4 mg/dL (ref 0.2–1.2)
BUN: 13 mg/dL (ref 7–25)
CO2: 20 mmol/L (ref 20–31)
CREATININE: 0.75 mg/dL (ref 0.50–1.05)
Calcium: 9.2 mg/dL (ref 8.6–10.4)
Chloride: 98 mmol/L (ref 98–110)
GFR, Est African American: >89 mL/min (ref >=60)
GLUCOSE: 85 mg/dL (ref 65–99)
Potassium: 4.6 mmol/L (ref 3.5–5.3)
SODIUM: 130 mmol/L — AB (ref 135–146)
TOTAL PROTEIN: 7.2 g/dL (ref 6.1–8.1)

## 2016-09-05 NOTE — Telephone Encounter (Signed)
Pt was not happy with [facility] so she is wanting to go to [place]. Harland Dingwall -NP was ok with this. I have called and scheduled pt with [facility] on Tuesday July 24th @ 8:15am. I will fax over order and will also give patient order to take with her today.  Order is for Diagnostic Mammogram Bilateral with Breast ultrasound if needed

## 2016-09-05 NOTE — Patient Instructions (Signed)
As discussed, you will receive a call from the sleep center regarding your CPAP.  Avoid sedating medications and alcohol and avoid sleeping flat on your back.   I would like to see you back after you have been using the CPAP for a month or so.   We will call you with lab results.    Sleep Apnea Sleep apnea is a condition in which breathing pauses or becomes shallow during sleep. Episodes of sleep apnea usually last 10 seconds or longer, and they may occur as many as 20 times an hour. Sleep apnea disrupts your sleep and keeps your body from getting the rest that it needs. This condition can increase your risk of certain health problems, including:  Heart attack.  Stroke.  Obesity.  Diabetes.  Heart failure.  Irregular heartbeat.  There are three kinds of sleep apnea:  Obstructive sleep apnea. This kind is caused by a blocked or collapsed airway.  Central sleep apnea. This kind happens when the part of the brain that controls breathing does not send the correct signals to the muscles that control breathing.  Mixed sleep apnea. This is a combination of obstructive and central sleep apnea.  What are the causes? The most common cause of this condition is a collapsed or blocked airway. An airway can collapse or become blocked if:  Your throat muscles are abnormally relaxed.  Your tongue and tonsils are larger than normal.  You are overweight.  Your airway is smaller than normal.  What increases the risk? This condition is more likely to develop in people who:  Are overweight.  Smoke.  Have a smaller than normal airway.  Are elderly.  Are female.  Drink alcohol.  Take sedatives or tranquilizers.  Have a family history of sleep apnea.  What are the signs or symptoms? Symptoms of this condition include:  Trouble staying asleep.  Daytime sleepiness and tiredness.  Irritability.  Loud snoring.  Morning headaches.  Trouble  concentrating.  Forgetfulness.  Decreased interest in sex.  Unexplained sleepiness.  Mood swings.  Personality changes.  Feelings of depression.  Waking up often during the night to urinate.  Dry mouth.  Sore throat.  How is this diagnosed? This condition may be diagnosed with:  A medical history.  A physical exam.  A series of tests that are done while you are sleeping (sleep study). These tests are usually done in a sleep lab, but they may also be done at home.  How is this treated? Treatment for this condition aims to restore normal breathing and to ease symptoms during sleep. It may involve managing health issues that can affect breathing, such as high blood pressure or obesity. Treatment may include:  Sleeping on your side.  Using a decongestant if you have nasal congestion.  Avoiding the use of depressants, including alcohol, sedatives, and narcotics.  Losing weight if you are overweight.  Making changes to your diet.  Quitting smoking.  Using a device to open your airway while you sleep, such as: ? An oral appliance. This is a custom-made mouthpiece that shifts your lower jaw forward. ? A continuous positive airway pressure (CPAP) device. This device delivers oxygen to your airway through a mask. ? A nasal expiratory positive airway pressure (EPAP) device. This device has valves that you put into each nostril. ? A bi-level positive airway pressure (BPAP) device. This device delivers oxygen to your airway through a mask.  Surgery if other treatments do not work. During surgery, excess tissue is  removed to create a wider airway.  It is important to get treatment for sleep apnea. Without treatment, this condition can lead to:  High blood pressure.  Coronary artery disease.  (Men) An inability to achieve or maintain an erection (impotence).  Reduced thinking abilities.  Follow these instructions at home:  Make any lifestyle changes that your health  care provider recommends.  Eat a healthy, well-balanced diet.  Take over-the-counter and prescription medicines only as told by your health care provider.  Avoid using depressants, including alcohol, sedatives, and narcotics.  Take steps to lose weight if you are overweight.  If you were given a device to open your airway while you sleep, use it only as told by your health care provider.  Do not use any tobacco products, such as cigarettes, chewing tobacco, and e-cigarettes. If you need help quitting, ask your health care provider.  Keep all follow-up visits as told by your health care provider. This is important. Contact a health care provider if:  The device that you received to open your airway during sleep is uncomfortable or does not seem to be working.  Your symptoms do not improve.  Your symptoms get worse. Get help right away if:  You develop chest pain.  You develop shortness of breath.  You develop discomfort in your back, arms, or stomach.  You have trouble speaking.  You have weakness on one side of your body.  You have drooping in your face. These symptoms may represent a serious problem that is an emergency. Do not wait to see if the symptoms will go away. Get medical help right away. Call your local emergency services (911 in the [place] Do not drive yourself to the hospital. This information is not intended to replace advice given to you by your health care provider. Make sure you discuss any questions you have with your health care provider. Document Released: 01/26/2002 Document Revised: 10/02/2015 Document Reviewed: 11/15/2014 Elsevier Interactive Patient Education  [employer].

## 2016-09-05 NOTE — Progress Notes (Signed)
   Subjective:    Patient ID: Autumn Robinson, female    DOB: [birth year], 51 y.o.   MRN: [id number]  HPI Chief Complaint  Patient presents with  . discuss sleep study    discuss sleep study- and follow-up on Vitamin D   She is here to discuss abnormal sleep study results. Per report, she has mild sleep apnea. She would like to start using a CPAP device and this was one treatment recommendation. Denies drinking alcohol before going to bed. States she rarely takes melatonin for sleep and no other sedating drugs used.   She had low sodium at our last visit and was feeling extremely tired, excessive sleepiness and was suspected to have sleep apnea. She reports feeling at least 50% improved. She was also having increased thirst and this has not changed. States she drinks 7 glasses of water daily.  Denies fever, chills, headache, dizziness, confusion, chest pain, palpitations, N/V/D. No edema.  Denies muscle weakness.   Labs were done and no obvious explanation for chronic hyponatremia.  She did have low vitamin D and just completed an 8 week course of vitamin D 50,000 IU weekly.   Crohn's disease- sees GI next week. Is taking methotrexate and last Remicade injection was last week. They are switching up her medications soon.   She is seeing her therapist again and states her mood is improving. States she feels "hopeful again".   She would like to go to [facility] to have her mammogram. Had an incomplete mammogram at the [facility] last July and does not want to return there.   Reviewed allergies, medications, past medical, surgical, and social history.   Review of Systems Pertinent positives and negatives in the history of present illness.     Objective:   Physical Exam BP 120/80   Pulse 80   Wt 198 lb 12.8 oz (90.2 kg)   BMI 36.36 kg/m   Alert and oriented and in no acute distress. Not otherwise examined.       Assessment & Plan:  OSA (obstructive sleep apnea)  Vitamin D  deficiency - Plan: Vitamin D, 25-hydroxy  Chronic hyponatremia - Plan: CBC with Differential/Platelet  Increased thirst  Inconclusive mammogram  Elevated ALT measurement - Plan: COMPLETE METABOLIC PANEL WITH GFR  Fatigue, unspecified type  Discussed that per her recent sleep study she has mild OSA and discussed options such as chin strap, dental appliance and CPAP. She would like to proceed with CPAP treatment.  Counseled on good sleep hygiene.  She is at least 50% improved. Continues to have fatigue and increased thirst. Will repeat sodium level.  Reviewed labs with her and no sign of diabetes.   She completed vitamin D once weekly treatment last week and will repeat her vitamin D today.  Order sent for mammogram since she prefers to go to [place].   She will continue seeing GI for Crohn's disease.  Continue seeing therapist for depression.   Follow up pending labs and 1 month following CPAP use.  Consider referral to endocrinologist for hyponatremia and symptoms.

## 2016-09-06 LAB — VITAMIN D 25 HYDROXY (VIT D DEFICIENCY, FRACTURES): Vit D, 25-Hydroxy: 27 ng/mL — ABNORMAL LOW (ref 30–100)

## 2016-09-10 LAB — HM MAMMOGRAPHY

## 2016-09-19 ENCOUNTER — Encounter: Payer: Self-pay | Admitting: Family Medicine

## 2016-10-01 ENCOUNTER — Ambulatory Visit: Payer: BC Managed Care – PPO | Admitting: Family Medicine

## 2016-10-03 ENCOUNTER — Encounter: Payer: Self-pay | Admitting: Family Medicine

## 2016-10-19 ENCOUNTER — Encounter: Payer: Self-pay | Admitting: Family Medicine

## 2016-10-19 ENCOUNTER — Telehealth: Payer: Self-pay | Admitting: Internal Medicine

## 2016-10-19 NOTE — Telephone Encounter (Signed)
Pt was at [facility] for a refitting for mask this morning at 10am per Jonni Sanger. Jonni Sanger could not find that she had an appointment today with him as his calendar was not showing up and told her he would get right with her,  pt got mad and stormed out and said she was going somewhere else. Jonni Sanger states she is noncomplaint and has only used her cpap mask 1 day > than 4 hours and 7 days < than 4 hours. Pt told Jonni Sanger that it was due for her sinuses and a cold she had going on. If pt goes somewhere else and lets [place] know then she will need to return the Device to [facility]. but andy is willing to work with her  Incase she calls [place] about this issue. Just FYI

## 2016-10-19 NOTE — Telephone Encounter (Signed)
Noted  

## 2016-11-01 ENCOUNTER — Ambulatory Visit: Payer: BC Managed Care – PPO | Admitting: Family Medicine

## 2016-11-28 ENCOUNTER — Ambulatory Visit (INDEPENDENT_AMBULATORY_CARE_PROVIDER_SITE_OTHER): Payer: BC Managed Care – PPO | Admitting: Family Medicine

## 2016-11-28 ENCOUNTER — Encounter: Payer: Self-pay | Admitting: Family Medicine

## 2016-11-28 ENCOUNTER — Telehealth: Payer: Self-pay | Admitting: Internal Medicine

## 2016-11-28 VITALS — BP 120/70 | HR 92 | Wt 198.2 lb

## 2016-11-28 DIAGNOSIS — G4733 Obstructive sleep apnea (adult) (pediatric): Secondary | ICD-10-CM

## 2016-11-28 DIAGNOSIS — Z23 Encounter for immunization: Secondary | ICD-10-CM | POA: Diagnosis not present

## 2016-11-28 NOTE — Progress Notes (Signed)
   Subjective:    Patient ID: Autumn Robinson, female    DOB: 08/18/1965, 51 y.o.   MRN: [id number]  HPI Chief Complaint  Patient presents with  . 4 month follow-up    4 month follow-up- on cpap supplies.    She is here today with concerns about not having a CPAP machine and wanting to get a new one from a different company.  States she thinks she needs a new sleep study since she is going with a new company for a CPAP machine.    Per her chart, she had a split night sleep study on 08/24/2016 and diagnosed with mild OSA.  She was seen on 09/05/2016 for OSA and did not follow up as recommended due to having issues with her CPAP.  States she got a sinus infection the day she got her first CPAP machine and was not able to use it. She has been unhappy with [employer] and returned the CPAP machine to [place] after a month of use.   States she is no longer having upper respiratory issues and wants to get her new CPAP machine to treat her OSA.  No new concerns today.   Denies fever, chills, chest pain, palpitations, shortness of breath, cough.   Reviewed allergies, medications, past medical, surgical, and social history.   Review of Systems Pertinent positives and negatives in the history of present illness.     Objective:   Physical Exam BP 120/70   Pulse 92   Wt 198 lb 3.2 oz (89.9 kg)   BMI 36.25 kg/m  Alert and oriented and in no acute distress. Not otherwise examined.       Assessment & Plan:  OSA (obstructive sleep apnea)  Needs flu shot - Plan: Flu Vaccine QUAD 36+ mos IM  Sabrina called [facility] and was informed that all that is needed for the patient to get her CPAP machine is her insurance information. [place] faxed needed information to [facility] and it was all explained to the patient. She apparently does not need a new sleep study. She will call if she has any concerns or questions and follow up after using the new CPAP machine for a full month.

## 2016-11-28 NOTE — Patient Instructions (Signed)
[facility] [address] [place], [place] [address] [phone]

## 2016-11-28 NOTE — Telephone Encounter (Signed)
Called home [facility] and all they needed to process for cpap device and supplies was the insurance card. I will fax over insurance card to them @ [phone]

## 2017-03-18 ENCOUNTER — Other Ambulatory Visit (INDEPENDENT_AMBULATORY_CARE_PROVIDER_SITE_OTHER): Payer: BC Managed Care – PPO

## 2017-03-18 DIAGNOSIS — Z111 Encounter for screening for respiratory tuberculosis: Secondary | ICD-10-CM | POA: Diagnosis not present

## 2017-03-21 ENCOUNTER — Other Ambulatory Visit: Payer: Self-pay

## 2017-03-21 LAB — TB SKIN TEST
INDURATION: NEGATIVE mm
TB Skin Test: NEGATIVE

## 2017-07-12 IMAGING — CR DG CHEST 2V
2 series · 2 of 2 positions shown · non-contrast
Comparison: Chest CT 05/01/2011

CLINICAL DATA: Cough, shortness of Breath

EXAM:
CHEST  2 VIEW

[lateral]
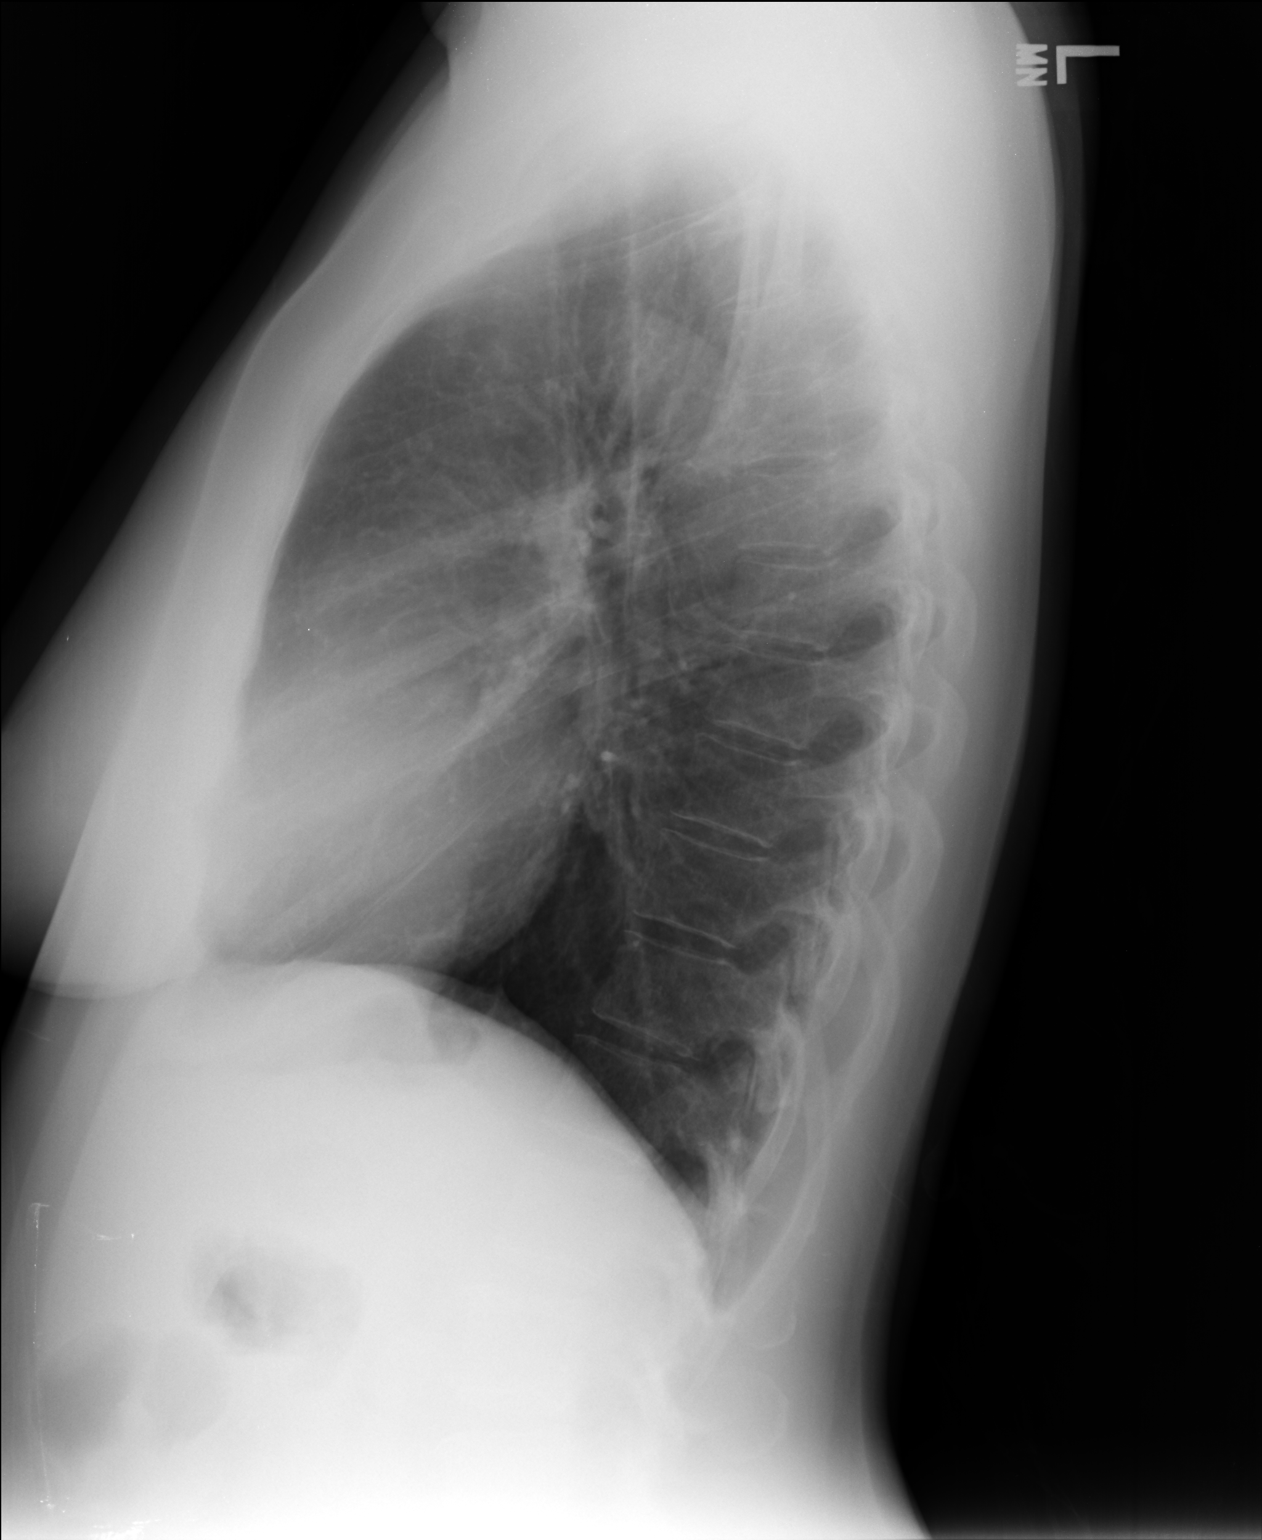

[PA]
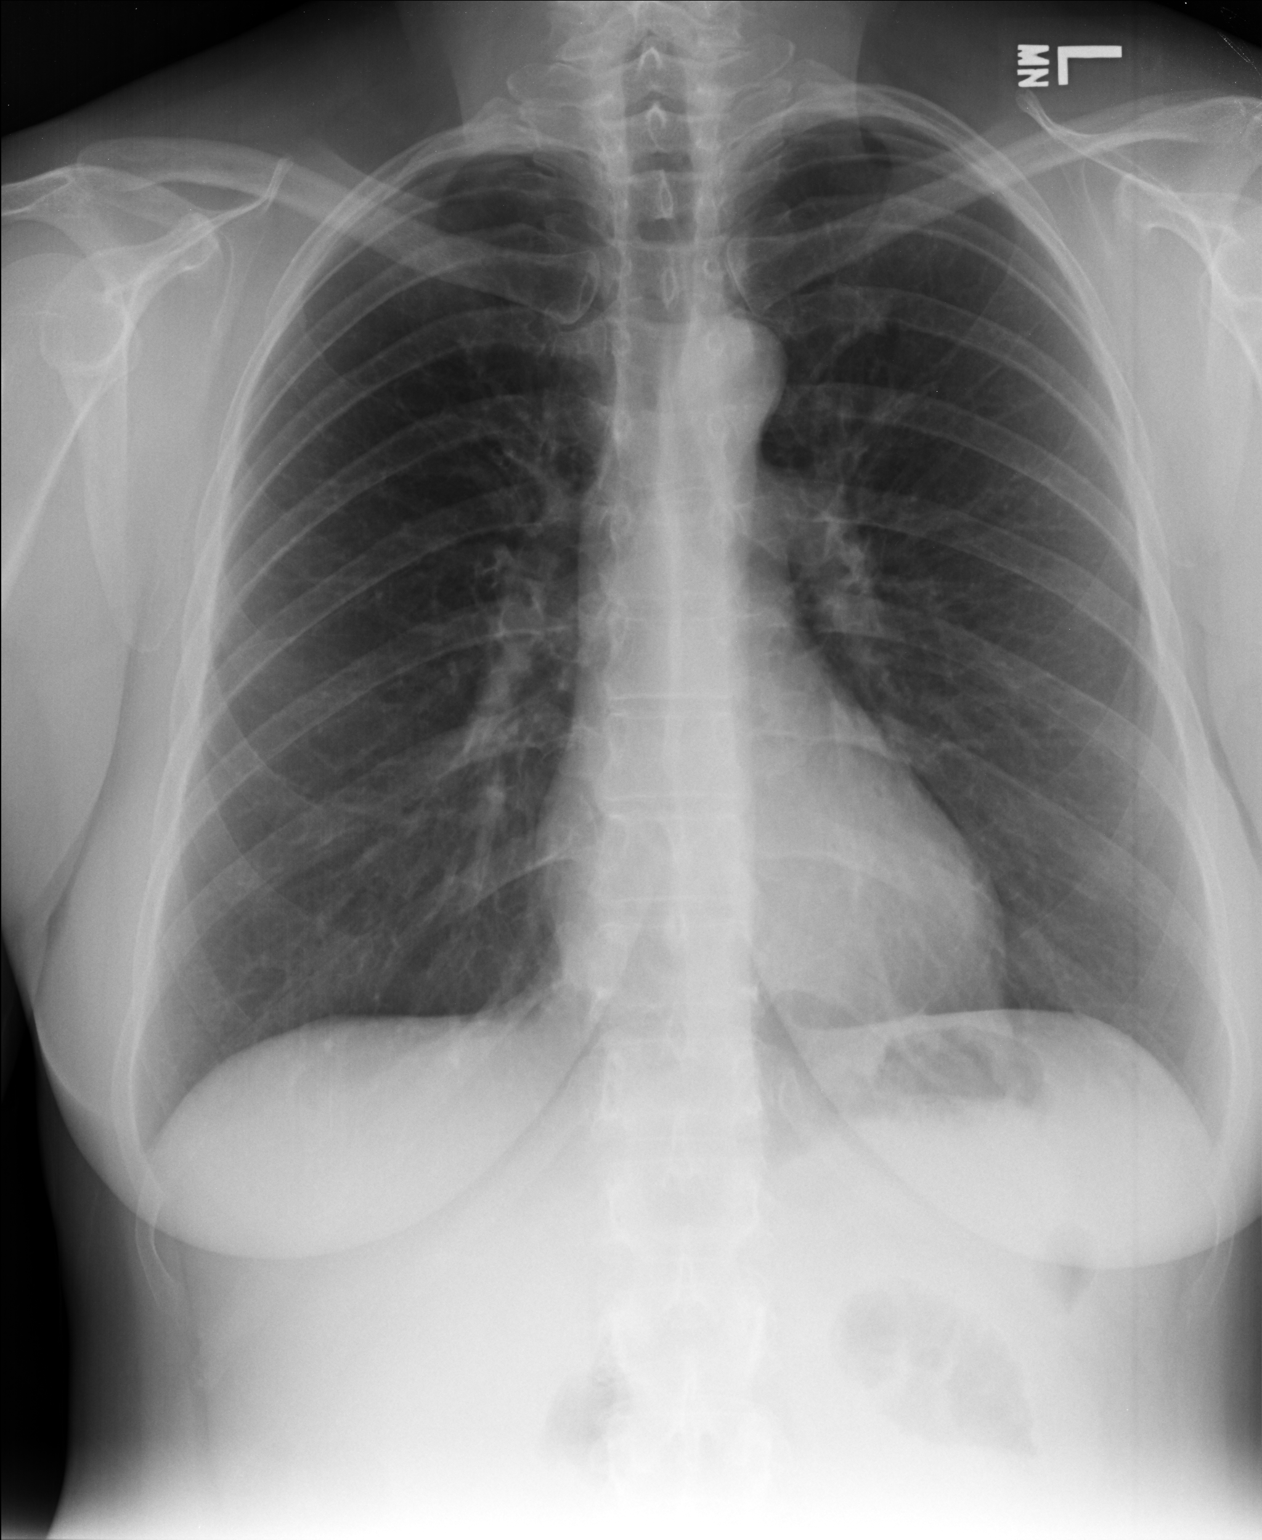

[2 of 2 positions shown; findings below may reference images not displayed]

FINDINGS: The heart size and mediastinal contours are within normal limits.
Both lungs are clear. The visualized skeletal structures are
unremarkable.
IMPRESSION: No active cardiopulmonary disease.

## 2018-04-07 ENCOUNTER — Other Ambulatory Visit: Payer: Self-pay | Admitting: Cardiology

## 2018-04-14 ENCOUNTER — Encounter: Payer: Self-pay | Admitting: Family Medicine

## 2018-04-14 ENCOUNTER — Ambulatory Visit: Payer: BC Managed Care – PPO | Admitting: Family Medicine

## 2018-04-14 VITALS — BP 140/80 | HR 80 | Wt 200.2 lb

## 2018-04-14 DIAGNOSIS — K50919 Crohn's disease, unspecified, with unspecified complications: Secondary | ICD-10-CM

## 2018-04-14 DIAGNOSIS — I471 Supraventricular tachycardia: Secondary | ICD-10-CM

## 2018-04-14 DIAGNOSIS — Z111 Encounter for screening for respiratory tuberculosis: Secondary | ICD-10-CM | POA: Diagnosis not present

## 2018-04-14 DIAGNOSIS — Z5320 Procedure and treatment not carried out because of patient's decision for unspecified reasons: Secondary | ICD-10-CM

## 2018-04-14 DIAGNOSIS — G4733 Obstructive sleep apnea (adult) (pediatric): Secondary | ICD-10-CM

## 2018-04-14 HISTORY — DX: Procedure and treatment not carried out because of patient's decision for unspecified reasons: Z53.20

## 2018-04-14 MED ORDER — METOPROLOL TARTRATE 25 MG PO TABS: 25.0000 mg | ORAL_TABLET | Freq: Every day | ORAL | 1 refills | Status: DC | PRN

## 2018-04-14 NOTE — Progress Notes (Signed)
   Subjective:    Patient ID: Autumn Robinson, female    DOB: [birth year], 52 y.o.   MRN: [id number]  HPI Chief Complaint  Patient presents with  . med check    med check, and needs quantiferon Gold    She is here today for medication management visit.  Requests refill of metoprolol.  States she takes this for PSVT and only as needed. Diagnosed in childhood with PSVT. States she has been followed by cardiology in the past but due to financial hardships, she cannot afford a visit to any more specialists right now.  PSVT episodes have been sporadic in the past and now becoming somewhat more frequent. Last episode was last night. She was able to cough and get it to stop.   States she has only needed 60 tablets in the past 2 years.   Occasionally she can break it with vagal maneuvers.   States she has been on new medications for Crohn's and she thinks this has made PSVT episodes more frequent. She is being weaned off of steroids.   Cardiologist- Dr. Ellyn Hack  GI- Dr. Cristela Blue at [facility] in [place].  Rheumatologist- Dr. Franki Monte in [place].   States rheumatologist started her on Cymbalta for depression and myalgias and her mood is much better.   Requests TB testing today due to upcoming Remicade infusion.  She did not get CPAP machine and supplies. Unclear as to what happened but will attempt to order this for her with a new company. She would like to start using her CPAP again due to having OSA. Last sleep study in 2018.   History of LDL greater than 200. She refuses to take a statin, never has taken one. Declines having cholesterol checked. States she will refuse to treat it. States she is aware that I recommend she be on medication for cholesterol.   Denies fever, chills, dizziness, chest pain, palpitations, shortness of breath, abdominal pain, N/V/D, urinary symptoms, LE edema.     Review of Systems Pertinent positives and negatives in the history of present illness.       Objective:   Physical Exam BP 140/80   Pulse 80   Wt 200 lb 3.2 oz (90.8 kg)   BMI 36.62 kg/m   Alert and in no distress. Pharyngeal area is normal. Neck is supple without adenopathy or thyromegaly. Cardiac exam shows a regular sinus rhythm without murmurs or gallops. Lungs are clear to auscultation. LE without edema. Skin is warm and dry.       Assessment & Plan:  PSVT (paroxysmal supraventricular tachycardia) (HCC) - Plan: metoprolol tartrate (LOPRESSOR) 25 MG tablet  Screening examination for pulmonary tuberculosis - Plan: QuantiFERON-TB Gold Plus  Crohn's disease with complication, unspecified gastrointestinal tract location (HCC)  Statin declined  OSA (obstructive sleep apnea)  Long history of PSVT and takes metoprolol PRN for this. She can often break the episodes by performing a vagal maneuver such as coughing. States she cannot afford to see her cardiologist right now and needs refill of metoprolol.  OSA and is not currently using CPAP. Needs machine and supplies. I will write a new order for her to get CPAP and supplies.  TB screening performed by [facility] Declines to have lipids checked. States she is not going to take a statin regardless of the results. She is aware of potential long term health consequences of uncontrolled HL.  Follow up after using CPAP for 4 weeks.

## 2018-04-16 LAB — QUANTIFERON-TB GOLD PLUS
QuantiFERON Mitogen Value: >10 IU/mL
QuantiFERON Nil Value: 0.02 IU/mL
QuantiFERON TB1 Ag Value: 0.02 IU/mL
QuantiFERON TB2 Ag Value: 0.02 IU/mL
QuantiFERON-TB Gold Plus: NEGATIVE

## 2018-04-23 ENCOUNTER — Telehealth: Payer: Self-pay | Admitting: Internal Medicine

## 2018-04-23 NOTE — Telephone Encounter (Signed)
Please see if she is willing to do this and if so, we can order the new sleep study for her. Thanks.

## 2018-04-23 NOTE — Telephone Encounter (Signed)
Ivin Booty from [facility] called to state that pt will have to have an updated sleep study as it was done back in 2018. Sleep study without equip is only good for 12 months.

## 2018-04-24 NOTE — Telephone Encounter (Signed)
Left message for pt to call me back 

## 2018-04-25 NOTE — Telephone Encounter (Signed)
Left message for pt to call me back 

## 2018-04-28 ENCOUNTER — Encounter: Payer: Self-pay | Admitting: Internal Medicine

## 2018-04-28 NOTE — Telephone Encounter (Signed)
Sent letter to pt as we have tried multiple times to call her to

## 2018-04-28 NOTE — Telephone Encounter (Signed)
Left message for pt to call me back 

## 2019-01-19 ENCOUNTER — Encounter: Payer: Self-pay | Admitting: Family Medicine

## 2019-01-19 ENCOUNTER — Ambulatory Visit: Payer: BC Managed Care – PPO | Admitting: Family Medicine

## 2019-01-19 ENCOUNTER — Other Ambulatory Visit: Payer: Self-pay

## 2019-01-19 VITALS — Temp 98.3°F | Wt 189.0 lb

## 2019-01-19 DIAGNOSIS — K50919 Crohn's disease, unspecified, with unspecified complications: Secondary | ICD-10-CM

## 2019-01-19 DIAGNOSIS — L03019 Cellulitis of unspecified finger: Secondary | ICD-10-CM | POA: Diagnosis not present

## 2019-01-19 MED ORDER — DOXYCYCLINE HYCLATE 100 MG PO TABS: 100.0000 mg | ORAL_TABLET | Freq: Two times a day (BID) | ORAL | 0 refills | Status: DC

## 2019-01-19 NOTE — Progress Notes (Signed)
   Subjective:    Patient ID: Autumn Robinson, female    DOB: Nov 26, 1965, 53 y.o.   MRN: [id number]  HPI Documentation for virtual telephone encounter. Documentation for virtual audio and video telecommunications through [place] encounter: The patient was located at home. The provider was located in the office. The patient did consent to this visit and is aware of possible charges through their insurance for this visit. The other persons participating in this telemedicine service were none. This virtual service is not related to other E/M service within previous 7 days. She states that 3 days ago she noted redness, warmth and an itchy feeling in one of her fingers.  She has a previous history of difficulty with a paronychia requiring I&D as well as different medications to get it under control.  She does have a history of Crohn's disease and is on Remicade.  When she noted the swelling and redness she started taking Cipro that she had leftover from a GI problem.     Review of Systems     Objective:   Physical Exam Alert and in no distress.  No visual signs were easily identified.       Assessment & Plan:  Paronychia of finger, unspecified laterality - Plan: doxycycline (VIBRA-TABS) 100 MG tablet  Crohn's disease with complication, unspecified gastrointestinal tract location [facility]) With her history I think it safe to put her on doxycycline.  Recommend she take a picture of the finger for comparison sake for later.  She will do this.  She is to check with me Wednesday or Thursday and let me know how she is doing.

## 2019-03-25 ENCOUNTER — Encounter: Payer: Self-pay | Admitting: Cardiology

## 2020-04-22 ENCOUNTER — Telehealth: Payer: Self-pay

## 2020-04-22 NOTE — Telephone Encounter (Signed)
VM for pt to advise of the need to contact [facility] for her colonoscopy. Pt was also advised to call our office if she has concerns. [place]

## 2020-06-27 LAB — HM COLONOSCOPY

## 2020-06-29 ENCOUNTER — Encounter: Payer: Self-pay | Admitting: Internal Medicine

## 2020-06-30 ENCOUNTER — Encounter: Payer: Self-pay | Admitting: Family Medicine

## 2021-11-28 ENCOUNTER — Ambulatory Visit: Payer: BC Managed Care – PPO | Admitting: Family Medicine

## 2021-11-28 ENCOUNTER — Encounter: Payer: Self-pay | Admitting: Family Medicine

## 2021-11-28 VITALS — BP 100/60 | HR 96 | Temp 97.3°F | Resp 12 | Ht 62.0 in | Wt 139.8 lb

## 2021-11-28 DIAGNOSIS — K50919 Crohn's disease, unspecified, with unspecified complications: Secondary | ICD-10-CM

## 2021-11-28 DIAGNOSIS — R091 Pleurisy: Secondary | ICD-10-CM

## 2021-11-28 NOTE — Progress Notes (Signed)
   Subjective:    Patient ID: Autumn Robinson, female    DOB: December 22, 1965, 56 y.o.   MRN: [id number]  HPI She states that last Wednesday she had an episode of shortness of breath followed the next day by some left-sided pleuritic type chest pain that also proceeded to be on the right side.  Does have a previous history of difficulty with pleurisy.  She is being treated for her Crohn's disease with Remicade.  No fever, chills, cough, sore throat or earache.   Review of Systems     Objective:   Physical Exam Alert and in no distress. Tympanic membranes and canals are normal. Pharyngeal area is normal. Neck is supple without adenopathy or thyromegaly. Cardiac exam shows a regular sinus rhythm without murmurs or gallops. Lungs are clear to auscultation.        Assessment & Plan:  Pleurisy  Crohn's disease with complication, unspecified gastrointestinal tract location (HCC) Recommend 2 Aleve twice per day.  Discussed the possibility of an x-ray but at this point she is comfortable with the present regimen.

## 2022-01-03 ENCOUNTER — Encounter: Payer: Self-pay | Admitting: Internal Medicine

## 2022-03-19 ENCOUNTER — Telehealth (INDEPENDENT_AMBULATORY_CARE_PROVIDER_SITE_OTHER): Payer: BC Managed Care – PPO | Admitting: Medical

## 2022-03-19 ENCOUNTER — Encounter: Payer: Self-pay | Admitting: Medical

## 2022-03-19 VITALS — Wt 140.0 lb

## 2022-03-19 DIAGNOSIS — Z8709 Personal history of other diseases of the respiratory system: Secondary | ICD-10-CM | POA: Diagnosis not present

## 2022-03-19 DIAGNOSIS — J3489 Other specified disorders of nose and nasal sinuses: Secondary | ICD-10-CM

## 2022-03-19 DIAGNOSIS — R058 Other specified cough: Secondary | ICD-10-CM | POA: Diagnosis not present

## 2022-03-19 DIAGNOSIS — J988 Other specified respiratory disorders: Secondary | ICD-10-CM | POA: Diagnosis not present

## 2022-03-19 DIAGNOSIS — Z79899 Other long term (current) drug therapy: Secondary | ICD-10-CM

## 2022-03-19 MED ORDER — AMOXICILLIN-POT CLAVULANATE 875-125 MG PO TABS: 1.0000 | ORAL_TABLET | Freq: Two times a day (BID) | ORAL | 0 refills | Status: DC

## 2022-03-19 MED ORDER — ALBUTEROL SULFATE HFA 108 (90 BASE) MCG/ACT IN AERS: 2.0000 | INHALATION_SPRAY | Freq: Four times a day (QID) | RESPIRATORY_TRACT | 0 refills | Status: DC | PRN

## 2022-03-19 NOTE — Progress Notes (Signed)
Subjective:     Patient ID: Autumn Robinson, female   DOB: September 24, 1965, 57 y.o.   MRN: [id number]  This visit type was conducted due to national recommendations for restrictions regarding the COVID-19 Pandemic (e.g. social distancing) in an effort to limit this patient's exposure and mitigate transmission in our community.  Due to their co-morbid illnesses, this patient is at least at moderate risk for complications without adequate follow up.  This format is felt to be most appropriate for this patient at this time.    Documentation for virtual audio and video telecommunications through [place] encounter:  The patient was located at home. The provider was located in the office. The patient did consent to this visit and is aware of possible charges through their insurance for this visit.  The other persons participating in this telemedicine service were none. Time spent on call was 20 minutes and in review of previous records 20 minutes total.  This virtual service is not related to other E/M service within previous 7 days.   HPI Chief Complaint  Patient presents with   possible pneumonia    Had flu a week ago Sunday. Feels like she might have possible pneumonia.    Is on Remicade for chron's disease.  Prone to lung infections.  She and husband both had influenza last week.  She was starting to feel better, but now feels worse with congestion now in chest and head.   Feels phlegm in chest, chest congestion despite mucinex.   Feeling some nausea.  Has some sinus pressure.  Feels achy in jaw and forehead.  Had fever the first 4 days of flu, but no fever now.  No vomiting.  Had some loose stools for a few days.  No SOB, but can hear some wheeze on exhale.   Feels fatigued.  Awaking in middle of night with nose congested.   No hx/o lung disease, nonsmoker.  Also using some advil.  Tried sudafed but this made her too dehydrated.  Uses budesonide for crohn's for flares, but not daily.  No other  aggravating or relieving factors. No other complaint.  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 is followed by Dr. Vertell Robinson at [facility] in [place], [facility]   Chest pain 06/05/2011   CT chest 04/2011:  No infiltrate, no effusion, no PE EKG 2013:  No change to suggest pericarditis    Crohn's disease ([place])    followed by Dr. Lew Robinson for this. visit from 10/12/2015 scanned into chart   GERD (gastroesophageal reflux disease) 05/01/2011   Moderate episode of recurrent major depressive disorder ([place]) 12/17/2012   OSA (obstructive sleep apnea)    mild per sleep study.    PSVT (paroxysmal supraventricular tachycardia)    Diagnosed in [facility] by Dr. Tomie Robinson   Current Outpatient Medications on File Prior to Visit  Medication Sig Dispense Refill   budesonide (ENTOCORT EC) 3 MG 24 hr capsule Take 6 mg by mouth daily.     Cholecalciferol (VITAMIN D PO) Take by mouth.     ECHINACEA PO Take by mouth.     Ibuprofen (ADVIL PO) Take by mouth.     inFLIXimab (REMICADE) 100 MG injection Inject into the vein.     LORazepam (ATIVAN) 1 MG tablet Reported on 06/14/2015     magnesium oxide (MAG-OX) 400 MG tablet Take 400 mg by mouth daily.  Menaquinone-7 (VITAMIN K2 PO) Take by mouth.     Menthol, Topical Analgesic, 2.5 % GEL Apply topically.     Naproxen Sodium (ALEVE PO) Take by mouth.     Potassium 99 MG TABS Take 2 tablets by mouth daily.     No current facility-administered medications on file prior to visit.    Review of Systems As in subjective      Objective:   Physical Exam Due to coronavirus pandemic stay at home measures, patient visit was virtual and they were not examined in person.   Wt 140 lb (63.5 kg)   LMP 08/18/2015   BMI 25.61 kg/m   Gen: wd, wn, nad, not a lot of coughing during interview No audible wheezing or labored breathing Answers questions appropriately     Assessment:      Encounter Diagnoses  Name Primary?   Productive cough Yes   History of influenza    High risk medication use    Sinus pressure    Respiratory tract infection        Plan:     We discussed symptoms and concerns.  Gave the option of chest x-ray and in person evaluation.  She wants to hold off currently.  Discussed limitations of virtual consult.  Begin medication as below Augmentin for antibiotic and albuterol for symptoms.  Continue to rest and hydrate well.  Consider continuing Mucinex DM over-the-counter the next several days  We discussed symptoms that would prompt in person recheck over the next few days including worse overall, fever, wheezing or shortness of breath, difficulty breathing, extreme weakness, uncontrollable vomiting.  She is on medicines that put her at risk for immunocompromise state.  Autumn Robinson was seen today for possible pneumonia.  Diagnoses and all orders for this visit:  Productive cough  History of influenza  High risk medication use  Sinus pressure  Respiratory tract infection  Other orders -     albuterol (VENTOLIN HFA) 108 (90 Base) MCG/ACT inhaler; Inhale 2 puffs into the lungs every 6 (six) hours as needed for wheezing or shortness of breath. -     amoxicillin-clavulanate (AUGMENTIN) 875-125 MG tablet; Take 1 tablet by mouth 2 (two) times daily.    F/u prn

## 2022-03-29 ENCOUNTER — Ambulatory Visit: Payer: BC Managed Care – PPO | Admitting: Medical

## 2022-03-29 VITALS — BP 124/64 | HR 88 | Temp 97.8°F | Resp 16 | Wt 140.0 lb

## 2022-03-29 DIAGNOSIS — J3489 Other specified disorders of nose and nasal sinuses: Secondary | ICD-10-CM

## 2022-03-29 DIAGNOSIS — R052 Subacute cough: Secondary | ICD-10-CM | POA: Diagnosis not present

## 2022-03-29 DIAGNOSIS — Z79899 Other long term (current) drug therapy: Secondary | ICD-10-CM

## 2022-03-29 DIAGNOSIS — J988 Other specified respiratory disorders: Secondary | ICD-10-CM

## 2022-03-29 MED ORDER — AMOXICILLIN 875 MG PO TABS: 875.0000 mg | ORAL_TABLET | Freq: Two times a day (BID) | ORAL | 0 refills | Status: AC

## 2022-03-29 NOTE — Progress Notes (Signed)
Subjective:  Autumn Robinson is a 57 y.o. female who presents for Chief Complaint  Patient presents with   follow-up on pneumonia    Follow-up on pneumonia. Doing better but still having congestion and mucous- yellow- still have some wheezing, cough. Just finished antibiotic yesterday and might need another round of antibiotic     Here for follow up on respiratory tract infection.  We initially did virtual consult 03/19/22.  At that time she noted that being on remicade for Crohns disease, she was prone to lung infections.  she had had influenza the last week of January, but by a week later she was having congestion ion head, chest, phlegm, worried about pneumonia.  At that visit we used Augmentin, inhaler and OTC remedies.     Today she is much better but still has some lingering yellow thick mucous coming from chest and head.  Still coughing a bit , sternum hurts from coughing so much,  but no current fever, body aches or chills.  Still has some wheezing.  She is using Mucinex sinus max once daily, albuterol once daily.  She is drinking plenty of fluids.  In the past she has to go on multiple round of antibiotics when she gets sick.  Last night was last Augmentin dose  Energy is much better than prior visit  No other aggravating or relieving factors.    No other c/o.  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 is followed by Dr. Vertell Limber at [facility] in [place], [facility]   Chest pain 06/05/2011   CT chest 04/2011:  No infiltrate, no effusion, no PE EKG 2013:  No change to suggest pericarditis    Crohn's disease ([place])    followed by Dr. Lew Dawes for this. visit from 10/12/2015 scanned into chart   GERD (gastroesophageal reflux disease) 05/01/2011   Moderate episode of recurrent major depressive disorder ([place]) 12/17/2012   OSA (obstructive sleep apnea)    mild per sleep study.    PSVT (paroxysmal supraventricular  tachycardia)    Diagnosed in [facility] by Dr. Tomie China   Current Outpatient Medications on File Prior to Visit  Medication Sig Dispense Refill   albuterol (VENTOLIN HFA) 108 (90 Base) MCG/ACT inhaler Inhale 2 puffs into the lungs every 6 (six) hours as needed for wheezing or shortness of breath. 18 g 0   budesonide (ENTOCORT EC) 3 MG 24 hr capsule Take 6 mg by mouth daily.     Cholecalciferol (VITAMIN D PO) Take by mouth.     ECHINACEA PO Take by mouth.     Ibuprofen (ADVIL PO) Take by mouth.     inFLIXimab (REMICADE) 100 MG injection Inject into the vein.     LORazepam (ATIVAN) 1 MG tablet Reported on 06/14/2015     magnesium oxide (MAG-OX) 400 MG tablet Take 400 mg by mouth daily.     Menaquinone-7 (VITAMIN K2 PO) Take by mouth.     Menthol, Topical Analgesic, 2.5 % GEL Apply topically.     Naproxen Sodium (ALEVE PO) Take by mouth.     Potassium 99 MG TABS Take 2 tablets by mouth daily.     No current facility-administered medications on file prior to visit.     The following portions of the patient's history were reviewed and updated as appropriate: allergies, current medications, past family history, past medical history, past social history, past  surgical history and problem list.  ROS Otherwise as in subjective above  Objective: BP 124/64   Pulse 88   Temp 97.8 F (36.6 C)   Resp 16   Wt 140 lb (63.5 kg)   LMP 08/18/2015   SpO2 99%   BMI 25.61 kg/m   Wt Readings from Last 3 Encounters:  03/29/22 140 lb (63.5 kg)  03/19/22 140 lb (63.5 kg)  11/28/21 139 lb 12.8 oz (63.4 kg)   General appearance: alert, no distress, well developed, well nourished HEENT: normocephalic, sclerae anicteric, conjunctiva pink and moist, TMs flat, nares patent but mild mucoid discharge , +erythema, pharynx with some postnasal drainage oral cavity: MMM, no lesions Neck: supple, no lymphadenopathy, no thyromegaly, no masses Heart: RRR, normal S1, S2, no murmurs Lungs: Somewhat  decreased with a few wheezes, no rhonchi, or rales Pulses: 2+ radial pulses, 2+ pedal pulses, normal cap refill Ext: no edema   Assessment: Encounter Diagnoses  Name Primary?   Respiratory tract infection Yes   Purulent nasal discharge    Subacute cough    High risk medication use      Plan: Overall improving but not quite resolved.  Begin 5 more days of antibiotic as below, increase albuterol to at least twice a day, more if needed, increase to sinus Mucinex max to twice a day, continue good water intake.  Do this regimen for the next 4-5 days.  If not much improved or resolving by that point, then let me know  Autumn Robinson was seen today for follow-up on pneumonia.  Diagnoses and all orders for this visit:  Respiratory tract infection  Purulent nasal discharge  Subacute cough  High risk medication use  Other orders -     amoxicillin (AMOXIL) 875 MG tablet; Take 1 tablet (875 mg total) by mouth 2 (two) times daily for 5 days.    Follow up: prn

## 2022-03-29 NOTE — Addendum Note (Signed)
Addended by: Carlena Hurl on: 03/29/2022 10:57 AM   Modules accepted: Orders

## 2022-05-23 ENCOUNTER — Ambulatory Visit (INDEPENDENT_AMBULATORY_CARE_PROVIDER_SITE_OTHER): Payer: BC Managed Care – PPO | Admitting: Nurse Practitioner

## 2022-05-23 ENCOUNTER — Encounter: Payer: Self-pay | Admitting: Nurse Practitioner

## 2022-05-23 VITALS — BP 118/74 | HR 92 | Ht 62.25 in | Wt 141.2 lb

## 2022-05-23 DIAGNOSIS — E782 Mixed hyperlipidemia: Secondary | ICD-10-CM | POA: Diagnosis not present

## 2022-05-23 DIAGNOSIS — Z780 Asymptomatic menopausal state: Secondary | ICD-10-CM

## 2022-05-23 DIAGNOSIS — Z975 Presence of (intrauterine) contraceptive device: Secondary | ICD-10-CM

## 2022-05-23 DIAGNOSIS — B070 Plantar wart: Secondary | ICD-10-CM

## 2022-05-23 DIAGNOSIS — G8929 Other chronic pain: Secondary | ICD-10-CM

## 2022-05-23 DIAGNOSIS — M545 Low back pain, unspecified: Secondary | ICD-10-CM

## 2022-05-23 DIAGNOSIS — G4733 Obstructive sleep apnea (adult) (pediatric): Secondary | ICD-10-CM

## 2022-05-23 DIAGNOSIS — Z0000 Encounter for general adult medical examination without abnormal findings: Secondary | ICD-10-CM | POA: Diagnosis not present

## 2022-05-23 DIAGNOSIS — K50919 Crohn's disease, unspecified, with unspecified complications: Secondary | ICD-10-CM

## 2022-05-23 DIAGNOSIS — Z79899 Other long term (current) drug therapy: Secondary | ICD-10-CM

## 2022-05-23 DIAGNOSIS — I471 Supraventricular tachycardia, unspecified: Secondary | ICD-10-CM

## 2022-05-23 HISTORY — DX: Low back pain, unspecified: M54.50

## 2022-05-23 MED ORDER — MELOXICAM 15 MG PO TABS
15.0000 mg | ORAL_TABLET | Freq: Every day | ORAL | 3 refills | Status: DC
Start: 2022-05-23 — End: 2022-09-23

## 2022-05-23 MED ORDER — CYCLOBENZAPRINE HCL 10 MG PO TABS: 5.0000 mg | ORAL_TABLET | Freq: Three times a day (TID) | ORAL | 2 refills | Status: DC | PRN

## 2022-05-23 NOTE — Progress Notes (Unsigned)
Autumn Clamp, DNP, AGNP-c [facility] [address] [place], [place] [address] Main Office [phone]  BP 118/74 (BP Location: Right Arm, Patient Position: Sitting, Cuff Size: Normal)   Pulse 92   Ht 5' 2.25" (1.581 m)   Wt 141 lb 3.2 oz (64 kg)   LMP 08/18/2015   BMI 25.62 kg/m    Subjective:    Patient ID: Autumn Robinson, female    DOB: [birth year], 57 y.o.   MRN: [id number]  HPI: Autumn Robinson is a 57 y.o. female presenting on 05/23/2022 for comprehensive medical examination.   Current medical concerns include: In addition to her comprehensive medical examination, seen send has concerns today with chronic daily back pain, menopausal symptoms, plantars wart, and cold feet.  Autumn Robinson reports chronic daily back pain which has worsened over the past few years.  She suspects the pain may be due to poor posture and is seeking exercises or an orthopedic consultation to address potential spinal misalignment from long-term poor posture habits.  She describes an exacerbation of her back pain when engaging in activities that require standing for long.'s time, such as cooking.  She tells me by the evening her pain is severe, likened to the sensation of a "rock embedded" in her spine, which adversely affects her quality of life.  Autumn Robinson has been managing her pain with Aleve but is interested in finding a more definitive solution to the underlying cause of her back problems, rather than just alleviation of symptoms.  Autumn Robinson does have an aversion to back surgery due to observation of negative outcomes and others.  She is interested in exploring nonsurgical options such as physical therapy to learn corrective postures and movements.  Additionally, Autumn Robinson is experiencing menopausal symptoms, particularly hot flashes, and is interested in estrogen replacement therapy.  She has noticed changes in her skin and overall physical appearance post menopause and has no personal or family history of  cancers that would contraindicate hormone replacement therapy.  She does currently have a copper IUD in place.  She has a preference for oral hormone replacement if this is appropriate.  Autumn Robinson is also dealing with a persistent plantars wart on the sole of her foot which has been causing pain and discomfort.  She has tried over-the-counter treatments with temporary relief however is seeking a more permanent solution.  Autumn Robinson mentions experiencing numbness and coldness in her feet, especially in the mornings, which she believes may be due to poor circulation.  She feels that increasing her physical activity could possibly help alleviate the symptoms.  Autumn Robinson has a medical history of frequent pneumonia and Crohn's disease.  She has been on long-term Remicade for her Crohn's however is currently considering discontinuation of the medication due to concerns about the long-term impact on her immune system. She mentions a history of paroxysmal supraventricular tachycardia and sleep apnea.  She tells me that since LEEP apnea symptoms have improved following weight loss.  ROS is negative with exception of what is listed in HPI  IMMUNIZATIONS:   Flu: Flu vaccine declined, patient does not wish to complete Prevnar 13: Prevnar 13 N/A for this patient Prevnar 20: Prevnar 20 N/A for this patient Pneumovax 23: Pneumovax 23 N/A for this patient Vac Shingrix: Shingrix declined, patient will complete at a later date HPV: HPV N/A for this patient Tetanus: Tetanus declined, patient will complete at a later date  HEALTH MAINTENANCE: Pap Smear HM Status: records needed Mammogram HM Status: records needed Colon Cancer Screening HM Status: records  needed Bone Density HM Status: is not applicable for this patient STI Testing HM Status: is not applicable for this patient  She reports regular vision exams q1-5y: Yes  She reports regular dental exams q 47m:  Yes  The patient eats a regular, healthy diet. She endorses  exercise and/or activity of: routine  Most Recent Depression Screen:     05/23/2022    8:30 AM 07/11/2016   11:08 AM 03/19/2015    8:20 AM  Depression screen PHQ 2/9  Decreased Interest 0 1 0  Down, Depressed, Hopeless 0 0 0  PHQ - 2 Score 0 1 0  Altered sleeping  3   Tired, decreased energy  3   Change in appetite  3   Feeling bad or failure about yourself   2   Trouble concentrating  2   Moving slowly or fidgety/restless  0   Suicidal thoughts  0   PHQ-9 Score  14    Most Recent Anxiety Screen:      No data to display         Most Recent Fall Screen:    05/23/2022    8:30 AM  Fall Risk   Falls in the past year? 0  Number falls in past yr: 0  Injury with Fall? 0  Risk for fall due to : No Fall Risks  Follow up Falls evaluation completed    Past medical history, surgical history, medications, allergies, family history and social history reviewed with patient today and changes made to appropriate areas of the chart.  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 is followed by Dr. Aline August at [facility] in [place], [facility]   Chest pain 06/05/2011   CT chest 04/2011:  No infiltrate, no effusion, no PE EKG 2013:  No change to suggest pericarditis    Crohn's disease    followed by Dr. Olena Mater for this. visit from 10/12/2015 scanned into chart   GERD (gastroesophageal reflux disease) 05/01/2011   Moderate episode of recurrent major depressive disorder 12/17/2012   Obesity (BMI 30-39.9) 08/10/2013   OSA (obstructive sleep apnea)    mild per sleep study.    PSVT (paroxysmal supraventricular tachycardia)    Diagnosed in [facility] by Dr. Sherren Kerns   Medications:  Current Outpatient Medications on File Prior to Visit  Medication Sig   budesonide (ENTOCORT EC) 3 MG 24 hr capsule Take 6 mg by mouth daily.   Cholecalciferol (VITAMIN D PO) Take by mouth.   ECHINACEA  PO Take by mouth.   inFLIXimab (REMICADE) 100 MG injection Inject into the vein.   LORazepam (ATIVAN) 1 MG tablet Reported on 06/14/2015   magnesium oxide (MAG-OX) 400 MG tablet Take 400 mg by mouth daily.   Menaquinone-7 (VITAMIN K2 PO) Take by mouth.   Menthol, Topical Analgesic, 2.5 % GEL Apply topically.   Potassium 99 MG TABS Take 2 tablets by mouth daily.   albuterol (VENTOLIN HFA) 108 (90 Base) MCG/ACT inhaler Inhale 2 puffs into the lungs every 6 (six) hours as needed for wheezing or shortness of breath. (Patient not taking: Reported on 05/23/2022)   No current facility-administered medications on file prior to visit.   Surgical History:  Past Surgical History:  Procedure Laterality Date   ABCESS DRAINAGE  1997, 1998, and again 8 years later   multiple perianal abcesses, several surgeries  Cardiac Event Monitor  October-November 2014   Demonstrated PSVT   TRANSTHORACIC ECHOCARDIOGRAM  October 2014   Normal LV size and function, EF 60-65%. No regional wall motion abnormalities. No notable valvular lesions. No effusion.   Allergies:  Allergies  Allergen Reactions   Sulfa Antibiotics Nausea Only and Other (See Comments)    Exhaustion   Family History:  Family History  Problem Relation Age of Onset   Allergies Mother    Hypertension Mother    GER disease Mother    Hiatal hernia Mother    Allergies Father    Hypertension Father    Diabetes Father    Esophageal cancer Maternal Grandmother    Cancer Maternal Grandmother    Macular degeneration Maternal Grandmother    Bone cancer Maternal Grandfather    Cancer Maternal Grandfather    Crohn's disease Sister    Heart disease Paternal Grandfather    Stroke Paternal Grandfather    Hypertension Unknown    Diabetes Unknown    Coronary artery disease Unknown    Arthritis Unknown    Osteoporosis Unknown    Hyperlipidemia Unknown        Objective:    BP 118/74 (BP Location: Right Arm, Patient Position: Sitting, Cuff Size:  Normal)   Pulse 92   Ht 5' 2.25" (1.581 m)   Wt 141 lb 3.2 oz (64 kg)   LMP 08/18/2015   BMI 25.62 kg/m   Wt Readings from Last 3 Encounters:  05/23/22 141 lb 3.2 oz (64 kg)  03/29/22 140 lb (63.5 kg)  03/19/22 140 lb (63.5 kg)    Physical Exam Vitals and nursing note reviewed.  Constitutional:      General: She is not in acute distress.    Appearance: Normal appearance.  HENT:     Head: Normocephalic and atraumatic.     Right Ear: Hearing, tympanic membrane, ear canal and external ear normal.     Left Ear: Hearing, tympanic membrane, ear canal and external ear normal.     Nose: Nose normal.     Right Sinus: No maxillary sinus tenderness or frontal sinus tenderness.     Left Sinus: No maxillary sinus tenderness or frontal sinus tenderness.     Mouth/Throat:     Lips: Pink.     Mouth: Mucous membranes are moist.     Pharynx: Oropharynx is clear.  Eyes:     General: Lids are normal. Vision grossly intact.     Extraocular Movements: Extraocular movements intact.     Conjunctiva/sclera: Conjunctivae normal.     Pupils: Pupils are equal, round, and reactive to light.     Funduscopic exam:    Right eye: Red reflex present.        Left eye: Red reflex present.    Visual Fields: Right eye visual fields normal and left eye visual fields normal.  Neck:     Thyroid: No thyromegaly.     Vascular: No carotid bruit.  Cardiovascular:     Rate and Rhythm: Normal rate and regular rhythm.     Chest Wall: PMI is not displaced.     Pulses: Normal pulses.          Dorsalis pedis pulses are 2+ on the right side and 2+ on the left side.       Posterior tibial pulses are 2+ on the right side and 2+ on the left side.     Heart sounds: Normal heart sounds. No murmur heard. Pulmonary:  Effort: Pulmonary effort is normal. No respiratory distress.     Breath sounds: Normal breath sounds.  Abdominal:     General: Abdomen is flat. Bowel sounds are normal. There is no distension.      Palpations: Abdomen is soft. There is no hepatomegaly, splenomegaly or mass.     Tenderness: There is no abdominal tenderness. There is no right CVA tenderness, left CVA tenderness, guarding or rebound.  Musculoskeletal:        General: Normal range of motion.     Cervical back: Full passive range of motion without pain, normal range of motion and neck supple. No tenderness.     Right lower leg: No edema.     Left lower leg: No edema.  Feet:     Left foot:     Toenail Condition: Left toenails are normal.  Lymphadenopathy:     Cervical: No cervical adenopathy.     Upper Body:     Right upper body: No supraclavicular adenopathy.     Left upper body: No supraclavicular adenopathy.  Skin:    General: Skin is warm and dry.     Capillary Refill: Capillary refill takes less than 2 seconds.     Nails: There is no clubbing.     Comments: Large wart on the heel of the foot and possible ball of the left foot.   Neurological:     General: No focal deficit present.     Mental Status: She is alert and oriented to person, place, and time.     GCS: GCS eye subscore is 4. GCS verbal subscore is 5. GCS motor subscore is 6.     Sensory: Sensation is intact.     Motor: Motor function is intact.     Coordination: Coordination is intact.     Gait: Gait is intact.     Deep Tendon Reflexes: Reflexes are normal and symmetric.  Psychiatric:        Attention and Perception: Attention normal.        Mood and Affect: Mood normal.        Speech: Speech normal.        Behavior: Behavior normal. Behavior is cooperative.        Thought Content: Thought content normal.        Cognition and Memory: Cognition and memory normal.        Judgment: Judgment normal.     Results for orders placed or performed in visit on 05/23/22  CBC with Differential/Platelet  Result Value Ref Range   WBC 6.7 3.4 - 10.8 x10E3/uL   RBC 4.60 3.77 - 5.28 x10E6/uL   Hemoglobin 15.0 11.1 - 15.9 g/dL   Hematocrit 17.6 16.0 - 46.6 %    MCV 95 79 - 97 fL   MCH 32.6 26.6 - 33.0 pg   MCHC 34.2 31.5 - 35.7 g/dL   RDW 73.7 10.6 - 26.9 %   Platelets 306 150 - 450 x10E3/uL   Neutrophils 46 Not Estab. %   Lymphs 40 Not Estab. %   Monocytes 9 Not Estab. %   Eos 4 Not Estab. %   Basos 1 Not Estab. %   Neutrophils Absolute 3.1 1.4 - 7.0 x10E3/uL   Lymphocytes Absolute 2.7 0.7 - 3.1 x10E3/uL   Monocytes Absolute 0.6 0.1 - 0.9 x10E3/uL   EOS (ABSOLUTE) 0.3 0.0 - 0.4 x10E3/uL   Basophils Absolute 0.1 0.0 - 0.2 x10E3/uL   Immature Granulocytes 0 Not Estab. %   Immature Grans (  Abs) 0.0 0.0 - 0.1 x10E3/uL  Comprehensive metabolic panel  Result Value Ref Range   Glucose 82 70 - 99 mg/dL   BUN 7 6 - 24 mg/dL   Creatinine, Ser 1.61 0.57 - 1.00 mg/dL   eGFR 89 >09 UE/AVW/0.98   BUN/Creatinine Ratio 9 9 - 23   Sodium 135 134 - 144 mmol/L   Potassium 4.8 3.5 - 5.2 mmol/L   Chloride 95 (L) 96 - 106 mmol/L   CO2 22 20 - 29 mmol/L   Calcium 9.4 8.7 - 10.2 mg/dL   Total Protein 7.1 6.0 - 8.5 g/dL   Albumin 4.5 3.8 - 4.9 g/dL   Globulin, Total 2.6 1.5 - 4.5 g/dL   Albumin/Globulin Ratio 1.7 1.2 - 2.2   Bilirubin Total 0.3 0.0 - 1.2 mg/dL   Alkaline Phosphatase 106 44 - 121 IU/L   AST 16 0 - 40 IU/L   ALT 17 0 - 32 IU/L  Hemoglobin A1c  Result Value Ref Range   Hgb A1c MFr Bld 5.2 4.8 - 5.6 %   Est. average glucose Bld gHb Est-mCnc 103 mg/dL  Lipid panel  Result Value Ref Range   Cholesterol, Total 379 (H) 100 - 199 mg/dL   Triglycerides 86 0 - 149 mg/dL   HDL 94 >11 mg/dL   VLDL Cholesterol Cal 12 5 - 40 mg/dL   LDL Chol Calc (NIH) 914 (H) 0 - 99 mg/dL   Lipid Comment: Comment    Chol/HDL Ratio 4.0 0.0 - 4.4 ratio  VITAMIN D 25 Hydroxy (Vit-D Deficiency, Fractures)  Result Value Ref Range   Vit D, 25-Hydroxy 76.7 30.0 - 100.0 ng/mL  TSH  Result Value Ref Range   TSH 1.360 0.450 - 4.500 uIU/mL         Assessment & Plan:   Problem List Items Addressed This Visit     High risk medication use    Labs pending  today for monitoring.       Relevant Orders   CBC with Differential/Platelet (Completed)   Comprehensive metabolic panel (Completed)   Hemoglobin A1c (Completed)   Lipid panel (Completed)   VITAMIN D 25 Hydroxy (Vit-D Deficiency, Fractures) (Completed)   TSH (Completed)   Crohn's disease    The patient is considering discontinuing Remicade due to concerns about immunosuppression. The patient's current health status is stable with no alarm symptoms present on examination.  Plan: - Advise a thorough discussion with the gastroenterologist at the upcoming May appointment.       Relevant Orders   CBC with Differential/Platelet (Completed)   Comprehensive metabolic panel (Completed)   Hemoglobin A1c (Completed)   Lipid panel (Completed)   VITAMIN D 25 Hydroxy (Vit-D Deficiency, Fractures) (Completed)   TSH (Completed)   Paroxysmal SVT (supraventricular tachycardia)    History of PSVT with no alarm symptoms present at this time. She has been followed with cardiology for this and managed with BB in the past. No current treatment. HRRR with no advantageous sounds. She historically has been able to perform valsalva and stop the abnormal beats. Will monitor.       IUD (intrauterine device) in place    Copper IUD in place.  At this time I do recommend that she consider having this removed soon as decreased circulating hormone levels post menopause may lead to cervical atrophy and difficulty with removal over time. No symptoms are present currently.        Relevant Orders   CBC with Differential/Platelet (Completed)  Comprehensive metabolic panel (Completed)   Hemoglobin A1c (Completed)   Lipid panel (Completed)   VITAMIN D 25 Hydroxy (Vit-D Deficiency, Fractures) (Completed)   TSH (Completed)   HLD (hyperlipidemia)    History of chronic hyperlipidemia with significant elevation in LDL. Chart review shows that she has not been interested in treatment for this and has declined previous  labs due to no interest in pursuing treatment. We will evaluate her levels today to determine where she stands and her risks. Fortunately, she has dropped her BMI substantially, which has hopefully helped with this matter.       Relevant Orders   CBC with Differential/Platelet (Completed)   Comprehensive metabolic panel (Completed)   Hemoglobin A1c (Completed)   Lipid panel (Completed)   VITAMIN D 25 Hydroxy (Vit-D Deficiency, Fractures) (Completed)   TSH (Completed)   Routine general medical examination at a health care facility    Overall benign examination today.  HM updated Labs pending.  Recommendations discussed.       OSA (obstructive sleep apnea)    History of OSA but has had substantial weight loss in the last few years. Her husband tells her she is no longer snoring. No alarm symptoms are present today.       Relevant Orders   CBC with Differential/Platelet (Completed)   Comprehensive metabolic panel (Completed)   Hemoglobin A1c (Completed)   Lipid panel (Completed)   VITAMIN D 25 Hydroxy (Vit-D Deficiency, Fractures) (Completed)   TSH (Completed)   Chronic bilateral low back pain without sciatica - Primary    The patient experiences daily back pain, which worsens when standing for extended periods. Evaluation shows no immediate red flags during the consultation. Sensation and strength are intact. Given that she wishes to avoid procedures, I feel that her best options are for physical therapy at this time.  Plan: - Physical therapy referral for evaluation and targeted exercises. - Lumbar spine x-ray ordered to evaluate alignment and exclude structural issues. - Prescribe meloxicam for sustained pain relief. - Prescribe Flexeril for use at bedtime as a muscle relaxant. - Recommend the use of a TENS unit for adjunct pain management.      Relevant Medications   cyclobenzaprine (FLEXERIL) 10 MG tablet   meloxicam (MOBIC) 15 MG tablet   Other Relevant Orders   DG Lumbar  Spine 2-3 Views   Ambulatory referral to Physical Therapy   Plantar wart of left foot   Relevant Orders   Ambulatory referral to Dermatology   Other Visit Diagnoses     Menopause              Follow up plan: Return for wart removal (need 45).  NEXT PREVENTATIVE PHYSICAL DUE IN 1 YEAR.  PATIENT COUNSELING PROVIDED FOR ALL ADULT PATIENTS:  Consume a well balanced diet low in saturated fats, cholesterol, and moderation in carbohydrates.   This can be as simple as monitoring portion sizes and cutting back on sugary beverages such as soda and juice to start with.    Daily water consumption of at least 64 ounces.  Physical activity at least 180 minutes per week, if just starting out.   This can be as simple as taking the stairs instead of the elevator and walking 2-3 laps around the office  purposefully every day.   STD protection, partner selection, and regular testing if high risk.  Limited consumption of alcoholic beverages if alcohol is consumed.  For women, I recommend no more than 7 alcoholic beverages per week, spread  out throughout the week.  Avoid "binge" drinking or consuming large quantities of alcohol in one setting.   Please let me know if you feel you may need help with reduction or quitting alcohol consumption.   Avoidance of nicotine, if used.  Please let me know if you feel you may need help with reduction or quitting nicotine use.   Daily mental health attention.  This can be in the form of 5 minute daily meditation, prayer, journaling, yoga, reflection, etc.   Purposeful attention to your emotions and mental state can significantly improve your overall wellbeing and Health.  Please know that I am here to help you with all of your health care goals and am happy to work with you to find a solution that works best for you.  The greatest advice I have received with any changes in life are to take it one step at a time, that even means if all you can focus on is  the next 60 seconds, then do that and celebrate your victories.  With any changes in life, you will have set backs, and that is OK. The important thing to remember is, if you have a set back, it is not a failure, it is an opportunity to try again!  Health Maintenance Recommendations Screening Testing Mammogram Every 1 -2 years based on history and risk factors Starting at age 49 Pap Smear Ages 21-39 every 3 years Ages 71-65 every 5 years with HPV testing More frequent testing may be required based on results and history Colon Cancer Screening Every 1-10 years based on test performed, risk factors, and history Starting at age 49 Bone Density Screening Every 2-10 years based on history Starting at age 77 for women Recommendations for men differ based on medication usage, history, and risk factors AAA Screening One time ultrasound Men 48-31 years old who have every smoked Lung Cancer Screening Low Dose Lung CT every 12 months Age 68-80 years with a 30 pack-year smoking history who still smoke or who have quit within the last 15 years  Screening Labs Routine  Labs: Complete Blood Count (CBC), Complete Metabolic Panel (CMP), Cholesterol (Lipid Panel) Every 6-12 months based on history and medications May be recommended more frequently based on current conditions or previous results Hemoglobin A1c Lab Every 3-12 months based on history and previous results Starting at age 45 or earlier with diagnosis of diabetes, high cholesterol, BMI >26, and/or risk factors Frequent monitoring for patients with diabetes to ensure blood sugar control Thyroid Panel (TSH w/ T3 & T4) Every 6 months based on history, symptoms, and risk factors May be repeated more often if on medication HIV One time testing for all patients 87 and older May be repeated more frequently for patients with increased risk factors or exposure Hepatitis C One time testing for all patients 43 and older May be repeated more  frequently for patients with increased risk factors or exposure Gonorrhea, Chlamydia Every 12 months for all sexually active persons 13-24 years Additional monitoring may be recommended for those who are considered high risk or who have symptoms PSA Men 40-76 years old with risk factors Additional screening may be recommended from age 71-69 based on risk factors, symptoms, and history  Vaccine Recommendations Tetanus Booster All adults every 10 years Flu Vaccine All patients 6 months and older every year COVID Vaccine All patients 12 years and older Initial dosing with booster May recommend additional booster based on age and health history HPV Vaccine 2 doses  all patients age 49-26 Dosing may be considered for patients over 26 Shingles Vaccine (Shingrix) 2 doses all adults 1 years and older Pneumonia (Pneumovax 23) All adults 51 years and older May recommend earlier dosing based on health history Pneumonia (Prevnar 13) All adults 34 years and older Dosed 1 year after Pneumovax 23  Additional Screening, Testing, and Vaccinations may be recommended on an individualized basis based on family history, health history, risk factors, and/or exposure.

## 2022-05-23 NOTE — Patient Instructions (Addendum)
I have included stretches for you to do. You can start these at anytime.   I put in an order for an x-ray for your back. You can walk-in to [facility] at your convenience.  I have sent the order for physical therapy, they will call to schedule this with you I sent in flexeril and meloxicam to use for pain  I will send in the hormone therapy once we get the labs back to make sure that it is safe to start.   I will find out who is accepting GYN patients I will send the referral.   We can have you come back to remove the wart at your convenience.   There are no preventive care reminders to display for this patient.   For all adult patients, I recommend A well balanced diet low in saturated fats, cholesterol, and moderation in carbohydrates.   This can be as simple as monitoring portion sizes and cutting back on sugary beverages such as soda and juice to start with.    Daily water consumption of at least 64 ounces.  Physical activity at least 180 minutes per week, if just starting out.   This can be as simple as taking the stairs instead of the elevator and walking 2-3 laps around the office  purposefully every day.   STD protection, partner selection, and regular testing if high risk.  Limited consumption of alcoholic beverages if alcohol is consumed.  For women, I recommend no more than 7 alcoholic beverages per week, spread out throughout the week.  Avoid "binge" drinking or consuming large quantities of alcohol in one setting.   Please let me know if you feel you may need help with reduction or quitting alcohol consumption.   Avoidance of nicotine, if used.  Please let me know if you feel you may need help with reduction or quitting nicotine use.   Daily mental health attention.  This can be in the form of 5 minute daily meditation, prayer, journaling, yoga, reflection, etc.   Purposeful attention to your emotions and mental state can significantly  improve your overall wellbeing  and  Health.  Please know that I am here to help you with all of your health care goals and am happy to work with you to find a solution that works best for you.  The greatest advice I have received with any changes in life are to take it one step at a time, that even means if all you can focus on is the next 60 seconds, then do that and celebrate your victories.  With any changes in life, you will have set backs, and that is OK. The important thing to remember is, if you have a set back, it is not a failure, it is an opportunity to try again!  Health Maintenance Recommendations Screening Testing Mammogram Every 1 -2 years based on history and risk factors Starting at age 9 Pap Smear Ages 21-39 every 3 years Ages 36-65 every 5 years with HPV testing More frequent testing may be required based on results and history Colon Cancer Screening Every 1-10 years based on test performed, risk factors, and history Starting at age 25 Bone Density Screening Every 2-10 years based on history Starting at age 41 for women Recommendations for men differ based on medication usage, history, and risk factors AAA Screening One time ultrasound Men 61-37 years old who have every smoked Lung Cancer Screening Low Dose Lung CT every 12  months Age 62-80 years with a 30 pack-year smoking history who still smoke or who have quit within the last 15 years  Screening Labs Routine  Labs: Complete Blood Count (CBC), Complete Metabolic Panel (CMP), Cholesterol (Lipid Panel) Every 6-12 months based on history and medications May be recommended more frequently based on current conditions or previous results Hemoglobin A1c Lab Every 3-12 months based on history and previous results Starting at age 75 or earlier with diagnosis of diabetes, high cholesterol, BMI >26, and/or risk factors Frequent monitoring for patients with diabetes to ensure blood sugar control Thyroid Panel (TSH w/  T3 & T4) Every 6 months based on history, symptoms, and risk factors May be repeated more often if on medication HIV One time testing for all patients 75 and older May be repeated more frequently for patients with increased risk factors or exposure Hepatitis C One time testing for all patients 59 and older May be repeated more frequently for patients with increased risk factors or exposure Gonorrhea, Chlamydia Every 12 months for all sexually active persons 13-24 years Additional monitoring may be recommended for those who are considered high risk or who have symptoms PSA Men 64-96 years old with risk factors Additional screening may be recommended from age 84-69 based on risk factors, symptoms, and history  Vaccine Recommendations Tetanus Booster All adults every 10 years Flu Vaccine All patients 6 months and older every year COVID Vaccine All patients 12 years and older Initial dosing with booster May recommend additional booster based on age and health history HPV Vaccine 2 doses all patients age 65-26 Dosing may be considered for patients over 26 Shingles Vaccine (Shingrix) 2 doses all adults 66 years and older Pneumonia (Pneumovax 23) All adults 76 years and older May recommend earlier dosing based on health history Pneumonia (Prevnar 67) All adults 29 years and older Dosed 1 year after Pneumovax 23  Additional Screening, Testing, and Vaccinations may be recommended on an individualized basis based on family history, health history, risk factors, and/or exposure.

## 2022-05-24 LAB — CBC WITH DIFFERENTIAL/PLATELET
Basophils Absolute: 0.1 10*3/uL (ref 0.0–0.2)
Basos: 1 %
EOS (ABSOLUTE): 0.3 10*3/uL (ref 0.0–0.4)
Eos: 4 %
Hematocrit: 43.9 % (ref 34.0–46.6)
Hemoglobin: 15 g/dL (ref 11.1–15.9)
Immature Grans (Abs): 0 10*3/uL (ref 0.0–0.1)
Immature Granulocytes: 0 %
Lymphocytes Absolute: 2.7 10*3/uL (ref 0.7–3.1)
Lymphs: 40 %
MCH: 32.6 pg (ref 26.6–33.0)
MCHC: 34.2 g/dL (ref 31.5–35.7)
MCV: 95 fL (ref 79–97)
Monocytes Absolute: 0.6 10*3/uL (ref 0.1–0.9)
Monocytes: 9 %
Neutrophils Absolute: 3.1 10*3/uL (ref 1.4–7.0)
Neutrophils: 46 %
Platelets: 306 10*3/uL (ref 150–450)
RBC: 4.6 x10E6/uL (ref 3.77–5.28)
RDW: 12 % (ref 11.7–15.4)
WBC: 6.7 10*3/uL (ref 3.4–10.8)

## 2022-05-24 LAB — COMPREHENSIVE METABOLIC PANEL
ALT: 17 IU/L (ref 0–32)
AST: 16 IU/L (ref 0–40)
Albumin/Globulin Ratio: 1.7 (ref 1.2–2.2)
Albumin: 4.5 g/dL (ref 3.8–4.9)
Alkaline Phosphatase: 106 IU/L (ref 44–121)
BUN/Creatinine Ratio: 9 (ref 9–23)
BUN: 7 mg/dL (ref 6–24)
Bilirubin Total: 0.3 mg/dL (ref 0.0–1.2)
CO2: 22 mmol/L (ref 20–29)
Calcium: 9.4 mg/dL (ref 8.7–10.2)
Chloride: 95 mmol/L — ABNORMAL LOW (ref 96–106)
Creatinine, Ser: 0.78 mg/dL (ref 0.57–1.00)
Globulin, Total: 2.6 g/dL (ref 1.5–4.5)
Glucose: 82 mg/dL (ref 70–99)
Potassium: 4.8 mmol/L (ref 3.5–5.2)
Sodium: 135 mmol/L (ref 134–144)
Total Protein: 7.1 g/dL (ref 6.0–8.5)
eGFR: 89 mL/min/{1.73_m2} (ref >59)

## 2022-05-24 LAB — HEMOGLOBIN A1C
Est. average glucose Bld gHb Est-mCnc: 103 mg/dL
Hgb A1c MFr Bld: 5.2 % (ref 4.8–5.6)

## 2022-05-24 LAB — LIPID PANEL
Chol/HDL Ratio: 4 ratio (ref 0.0–4.4)
Cholesterol, Total: 379 mg/dL — ABNORMAL HIGH (ref 100–199)
HDL: 94 mg/dL (ref >39)
LDL Chol Calc (NIH): 273 mg/dL — ABNORMAL HIGH (ref 0–99)
Triglycerides: 86 mg/dL (ref 0–149)
VLDL Cholesterol Cal: 12 mg/dL (ref 5–40)

## 2022-05-24 LAB — TSH: TSH: 1.36 u[IU]/mL (ref 0.450–4.500)

## 2022-05-24 LAB — VITAMIN D 25 HYDROXY (VIT D DEFICIENCY, FRACTURES): Vit D, 25-Hydroxy: 76.7 ng/mL (ref 30.0–100.0)

## 2022-05-30 ENCOUNTER — Encounter: Payer: Self-pay | Admitting: Nurse Practitioner

## 2022-05-30 DIAGNOSIS — B070 Plantar wart: Secondary | ICD-10-CM

## 2022-05-30 DIAGNOSIS — Z79899 Other long term (current) drug therapy: Secondary | ICD-10-CM

## 2022-05-30 HISTORY — DX: Plantar wart: B07.0

## 2022-05-30 HISTORY — DX: Other long term (current) drug therapy: Z79.899

## 2022-05-30 NOTE — Assessment & Plan Note (Addendum)
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 She has been followed with cardiology for this and managed with BB in the past. No current treatment. HRRR with no advantageous sounds. She historically has been able to perform valsalva and stop the abnormal beats. Will monitor.

## 2022-05-30 NOTE — Assessment & Plan Note (Signed)
Copper IUD in place.  At this time I do recommend that she consider having this removed soon as decreased circulating hormone levels post menopause may lead to cervical atrophy and difficulty with removal over time. No symptoms are present currently.

## 2022-05-30 NOTE — Assessment & Plan Note (Signed)
Overall benign examination today.  HM updated Labs pending.  Recommendations discussed.

## 2022-05-30 NOTE — Assessment & Plan Note (Signed)
The patient experiences daily back pain, which worsens when standing for extended periods. Evaluation shows no immediate red flags during the consultation. Sensation and strength are intact. Given that she wishes to avoid procedures, I feel that her best options are for physical therapy at this time.  Plan: - Physical therapy referral for evaluation and targeted exercises. - Lumbar spine x-ray ordered to evaluate alignment and exclude structural issues. - Prescribe meloxicam for sustained pain relief. - Prescribe Flexeril for use at bedtime as a muscle relaxant. - Recommend the use of a TENS unit for adjunct pain management.

## 2022-05-30 NOTE — Assessment & Plan Note (Signed)
The patient is considering discontinuing Remicade due to concerns about immunosuppression. The patient's current health status is stable with no alarm symptoms present on examination.  Plan: - Advise a thorough discussion with the gastroenterologist at the upcoming May appointment.

## 2022-05-30 NOTE — Assessment & Plan Note (Addendum)
History of OSA but has had substantial weight loss in the last few years. Her husband tells her she is no longer snoring. No alarm symptoms are present today.

## 2022-05-30 NOTE — Assessment & Plan Note (Signed)
History of chronic hyperlipidemia with significant elevation in LDL. Chart review shows that she has not been interested in treatment for this and has declined previous labs due to no interest in pursuing treatment. We will evaluate her levels today to determine where she stands and her risks. Fortunately, she has dropped her BMI substantially, which has hopefully helped with this matter.

## 2022-05-31 NOTE — Assessment & Plan Note (Signed)
Labs pending today for monitoring.  

## 2022-06-05 ENCOUNTER — Ambulatory Visit
Admission: RE | Admit: 2022-06-05 | Discharge: 2022-06-05 | Disposition: A | Discharge status: Home / Self Care | Payer: BC Managed Care – PPO | Source: Ambulatory Visit | Attending: Nurse Practitioner | Admitting: Nurse Practitioner

## 2022-06-05 DIAGNOSIS — M545 Low back pain, unspecified: Secondary | ICD-10-CM

## 2022-06-12 ENCOUNTER — Encounter: Payer: Self-pay | Admitting: Nurse Practitioner

## 2022-06-12 ENCOUNTER — Ambulatory Visit: Payer: BC Managed Care – PPO | Admitting: Nurse Practitioner

## 2022-06-12 VITALS — BP 130/78 | HR 72 | Wt 139.8 lb

## 2022-06-12 DIAGNOSIS — B070 Plantar wart: Secondary | ICD-10-CM

## 2022-06-15 ENCOUNTER — Telehealth: Payer: Self-pay | Admitting: Nurse Practitioner

## 2022-06-15 NOTE — Telephone Encounter (Signed)
Autumn Robinson called and asked when you could send in her estrogen? She uses [id number] - [place], [place] - [address]

## 2022-06-19 ENCOUNTER — Other Ambulatory Visit: Payer: Self-pay | Admitting: Nurse Practitioner

## 2022-06-19 DIAGNOSIS — N959 Unspecified menopausal and perimenopausal disorder: Secondary | ICD-10-CM

## 2022-06-19 MED ORDER — MEDROXYPROGESTERONE ACETATE 2.5 MG PO TABS
2.5000 mg | ORAL_TABLET | Freq: Every day | ORAL | 0 refills | Status: DC
Start: 2022-06-19 — End: 2022-09-17

## 2022-06-19 MED ORDER — ESTRADIOL 0.5 MG PO TABS: 0.5000 mg | ORAL_TABLET | Freq: Every day | ORAL | 0 refills | Status: DC

## 2022-07-02 NOTE — Assessment & Plan Note (Signed)
Examination reveals tender, flesh colored papule with thick keratin layer on left heel. No warmth, erythema, or drainage present.  Verbal consent obtained for removal today. Area cleansed with betadine and draped in sterile fashion. 1% lidocaine buffered with 3:1 sodium bicarb utilized to infiltrate area surrounding the wart.  #10 blade utilized to gently pare down the outermost layer of cells.  In usual fashion, lesion frozen with liquid nitrogen with 1mm border and allowed to thaw for 1 minute. Procedure repeated 2 additional times in same fashion.  Patient tolerated procedure well. No bleeding present upon completion.  Sterile bandage placed and instructions provided for return. Will likely need repeat excision for complete treatment.

## 2022-07-02 NOTE — Progress Notes (Signed)
  Tollie Eth, DNP, AGNP-c [facility] [address] [place], [place] [address] [phone]  Subjective:   Autumn Robinson is a 57 y.o. female presents to day for evaluation of: Plantar Wart Removal Autumn Robinson has a history of plantar wart on the heel of the left foot that has been growing in size over the past several years. The area is large and painful. She requests removal.   PMH, Medications, and Allergies reviewed and updated in chart as appropriate.   ROS negative except for what is listed in HPI. Objective:  BP 130/78   Pulse 72   Wt 139 lb 12.8 oz (63.4 kg)   LMP 08/18/2015   BMI 25.36 kg/m  Physical Exam Cardiovascular:     Pulses:          Dorsalis pedis pulses are 2+ on the right side and 2+ on the left side.       Posterior tibial pulses are 2+ on the right side and 2+ on the left side.  Musculoskeletal:     Right foot: Normal range of motion.     Left foot: Normal range of motion.       Feet:  Feet:     Right foot:     Protective Sensation: 10 sites tested.  10 sites sensed.     Skin integrity: Skin integrity normal.     Toenail Condition: Right toenails are normal.     Left foot:     Protective Sensation: 10 sites tested.  10 sites sensed.     Toenail Condition: Left toenails are normal.           Assessment & Plan:   Problem List Items Addressed This Visit     Plantar wart of left foot - Primary    Examination reveals tender, flesh colored papule with thick keratin layer on left heel. No warmth, erythema, or drainage present.  Verbal consent obtained for removal today. Area cleansed with betadine and draped in sterile fashion. 1% lidocaine buffered with 3:1 sodium bicarb utilized to infiltrate area surrounding the wart.  #10 blade utilized to gently pare down the outermost layer of cells.  In usual fashion, lesion frozen with liquid nitrogen with 1mm border and allowed to thaw for 1 minute. Procedure repeated 2 additional times in  same fashion.  Patient tolerated procedure well. No bleeding present upon completion.  Sterile bandage placed and instructions provided for return. Will likely need repeat excision for complete treatment.          Tollie Eth, DNP, AGNP-c 07/02/2022  7:07 PM    History, Medications, Surgery, SDOH, and Family History reviewed and updated as appropriate.

## 2022-09-16 ENCOUNTER — Other Ambulatory Visit: Payer: Self-pay | Admitting: Nurse Practitioner

## 2022-09-16 DIAGNOSIS — N959 Unspecified menopausal and perimenopausal disorder: Secondary | ICD-10-CM

## 2022-09-20 ENCOUNTER — Other Ambulatory Visit: Payer: Self-pay | Admitting: Nurse Practitioner

## 2022-09-20 DIAGNOSIS — M545 Low back pain, unspecified: Secondary | ICD-10-CM

## 2022-09-20 NOTE — Telephone Encounter (Signed)
Refill request last apt 05/23/22.  

## 2023-03-14 ENCOUNTER — Other Ambulatory Visit: Payer: Self-pay | Admitting: Nurse Practitioner

## 2023-03-14 DIAGNOSIS — N959 Unspecified menopausal and perimenopausal disorder: Secondary | ICD-10-CM

## 2023-05-02 ENCOUNTER — Telehealth (INDEPENDENT_AMBULATORY_CARE_PROVIDER_SITE_OTHER): Admitting: Family Medicine

## 2023-05-02 ENCOUNTER — Ambulatory Visit: Payer: Self-pay | Admitting: Nurse Practitioner

## 2023-05-02 DIAGNOSIS — J3489 Other specified disorders of nose and nasal sinuses: Secondary | ICD-10-CM

## 2023-05-02 DIAGNOSIS — R059 Cough, unspecified: Secondary | ICD-10-CM

## 2023-05-02 MED ORDER — DOXYCYCLINE HYCLATE 100 MG PO TABS: 100.0000 mg | ORAL_TABLET | Freq: Two times a day (BID) | ORAL | 0 refills | Status: DC

## 2023-05-02 MED ORDER — BENZONATATE 100 MG PO CAPS: ORAL_CAPSULE | ORAL | 0 refills | Status: DC

## 2023-05-02 NOTE — Progress Notes (Signed)
 Virtual Visit via Video Note  I connected with Autumn Robinson  on 05/02/23 at  5:40 PM EDT by a video enabled telemedicine application and verified that I am speaking with the correct person using two identifiers.  Location patient: [place] Location provider:work or home office Persons participating in the virtual visit: patient, provider  I discussed the limitations and requested verbal permission for telemedicine visit. The patient expressed understanding and agreed to proceed.   HPI:  Acute telemedicine visit for a cough: -Onset: with the flu about 1.5 weeks ago -Symptoms include: fever has resolved, and was feeling better, but then got worse again with dry cough now becoming productive, some fatigue and has developed some sinus discomfort and some chills, poor sleep due to cough -Denies: SOB, CP, wheezing, fevers now, NVD, HA or facial pain that is severe -Pertinent past medical history: see below, has a hx of Crohn's, no longer on immunosuppressives and has not had a flare or required abx or steroids recently; reports has hx of developing pneumonia or 2ncoundary infections after flu or VURIs.  -has albuterol she has used in the past -Pertinent medication allergies: Allergies  Allergen Reactions   Sulfa Antibiotics Nausea Only and Other (See Comments)    Exhaustion   -COVID-19 vaccine status:  Immunization History  Administered Date(s) Administered   Influenza Split 11/20/2010, 12/27/2011   Influenza,inj,Quad PF,6+ Mos 11/28/2016   PPD Test 09/15/2013, 12/28/2015, 03/18/2017   Pneumococcal Polysaccharide-23 12/22/2012   Tdap 01/20/2007     ROS: See pertinent positives and negatives per HPI.  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 is followed by Dr. Aline August at [facility] in [place], [facility]   Chest pain 06/05/2011   CT chest 04/2011:  No infiltrate, no effusion, no PE EKG 2013:  No change to suggest  pericarditis    Crohn's disease (HCC)    followed by Dr. Olena Mater for this. 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    PSVT (paroxysmal supraventricular tachycardia) (HCC)    Diagnosed in [facility] by Dr. Sherren Kerns    Past Surgical History:  Procedure Laterality Date   ABCESS DRAINAGE  1997, 1998, and again 8 years later   multiple perianal abcesses, several surgeries   Cardiac Event Monitor  October-November 2014   Demonstrated PSVT   TRANSTHORACIC ECHOCARDIOGRAM  October 2014   Normal LV size and function, EF 60-65%. No regional wall motion abnormalities. No notable valvular lesions. No effusion.     Current Outpatient Medications:    benzonatate (TESSALON PERLES) 100 MG capsule, 1-2 capsules up to twice daily as needed for cough., Disp: 30 capsule, Rfl: 0   doxycycline (VIBRA-TABS) 100 MG tablet, Take 1 tablet (100 mg total) by mouth 2 (two) times daily., Disp: 14 tablet, Rfl: 0   albuterol (VENTOLIN HFA) 108 (90 Base) MCG/ACT inhaler, Inhale 2 puffs into the lungs every 6 (six) hours as needed for wheezing or shortness of breath., Disp: 18 g, Rfl: 0   budesonide (ENTOCORT EC) 3 MG 24 hr capsule, Take 6 mg by mouth daily., Disp: , Rfl:    Cholecalciferol (VITAMIN D PO), Take by mouth., Disp: , Rfl:    cyclobenzaprine (FLEXERIL) 10 MG  tablet, Take 0.5-1 tablets (5-10 mg total) by mouth 3 (three) times daily as needed for muscle spasms., Disp: 30 tablet, Rfl: 2   ECHINACEA PO, Take by mouth., Disp: , Rfl:    estradiol (ESTRACE) 0.5 MG tablet, TAKE 1 TABLET BY MOUTH EVERY DAY, Disp: 90 tablet, Rfl: 1   inFLIXimab (REMICADE) 100 MG injection, Inject into the vein., Disp: , Rfl:    LORazepam (ATIVAN) 1 MG tablet, Reported on 06/14/2015, Disp: , Rfl:     magnesium oxide (MAG-OX) 400 MG tablet, Take 400 mg by mouth daily., Disp: , Rfl:    medroxyPROGESTERone (PROVERA) 2.5 MG tablet, TAKE 1 TABLET BY MOUTH EVERY DAY, Disp: 90 tablet, Rfl: 1   meloxicam (MOBIC) 15 MG tablet, TAKE 1 TABLET (15 MG TOTAL) BY MOUTH DAILY., Disp: 30 tablet, Rfl: 3   Menaquinone-7 (VITAMIN K2 PO), Take by mouth., Disp: , Rfl:    Menthol, Topical Analgesic, 2.5 % GEL, Apply topically., Disp: , Rfl:    Potassium 99 MG TABS, Take 2 tablets by mouth daily., Disp: , Rfl:   EXAM:  VITALS per patient if applicable:  GENERAL: alert, oriented, appears well and in no acute distress  HEENT: atraumatic, conjunttiva clear, no obvious abnormalities on inspection of external nose and ears  NECK: normal movements of the head and neck  LUNGS: on inspection no signs of respiratory distress, breathing rate appears normal, no obvious gross SOB, gasping or wheezing  CV: no obvious cyanosis  MS: moves all visible extremities without noticeable abnormality  PSYCH/NEURO: pleasant and cooperative, no obvious depression or anxiety, speech and thought processing grossly intact  ASSESSMENT AND PLAN:  Discussed the following assessment and plan:  Cough, unspecified type  Sinus pressure  -we discussed possible serious and likely etiologies, options for evaluation and workup, limitations of telemedicine visit vs in person visit, treatment, treatment risks and precautions. Pt is agreeable to treatment via telemedicine at this moment. Discussed prolonged flu symptoms vs developing 2ndary bacterial resp illness vs other. She wants to try empiric abx. Discussed potential risks vs benefits and various options, alternatives. Rx for doxy 100mg  bid x 10 days and tessalon provided.  Advised to seek prompt virtual visit or in person care if worsening, new symptoms arise, or if is not improving with treatment as expected per our conversation of expected course. Discussed options for follow up care.  Did let this patient know that I do telemedicine on Tuesdays and Thursdays for [place] and those are the days I am logged into the system. Advised to schedule follow up visit with PCP, [place] virtual visits or [facility] if any further questions or concerns to avoid delays in care.   I discussed the assessment and treatment plan with the patient. The patient was provided an opportunity to ask questions and all were answered. The patient agreed with the plan and demonstrated an understanding of the instructions.     Terressa Koyanagi, DO

## 2023-05-02 NOTE — Telephone Encounter (Signed)
 Copied from CRM [id number]. Topic: Clinical - Red Word Triage >> May 02, 2023  4:00 PM Autumn Robinson wrote: Red Word that prompted transfer to Nurse Triage: recovered flu but possible bronchitis now, cough, low energy, weight loss, leg and buttock pain like pins and needles, low body temp 94   Chief Complaint: dry cough since recovering from flu  Symptoms: frequent cough, mild SOB, low temp  Frequency: 6 days clear white Pertinent Negatives: Patient denies chest pain, fever Disposition: [] ED /[] Urgent Care (no appt availability in office) / [x] Appointment(In office/virtual)/ []  [place] [facility]/ [] Home Care/ [] Refused Recommended Disposition /[] [place] [facility]/ []  Follow-up with PCP Additional Notes: pt wanted to be seen as soon as possible because of her lowered immune system from the infusions for her Crohn's disease. Assisted pt with MyChart and setting up appt.   Reason for Disposition  [1] MILD difficulty breathing (e.g., minimal/no SOB at rest, SOB with walking, pulse <100) AND [2] still present when not coughing  Answer Assessment - Initial Assessment Questions 1. ONSET: "When did the cough begin?"      6 days  2. SEVERITY: "How bad is the cough today?"      Frequent cough,  3. SPUTUM: "Describe the color of your sputum" (none, dry cough; clear, white, yellow, green)     clear 5. DIFFICULTY BREATHING: "Are you having difficulty breathing?" If Yes, ask: "How bad is it?" (e.g., mild, moderate, severe)    - MILD: No SOB at rest, mild SOB with walking, speaks normally in sentences, can lie down, no retractions, pulse < 100.    - MODERATE: SOB at rest, SOB with minimal exertion and prefers to sit, cannot lie down flat, speaks in phrases, mild retractions, audible wheezing, pulse 100-120.    - SEVERE: Very SOB at rest, speaks in single words, struggling to breathe, sitting hunched forward, retractions, pulse > 120     mild 6. FEVER: "Do you have a fever?" If Yes, ask: "What is  your temperature, how was it measured, and when did it start?"     No low  96.4 highest it has been   8. LUNG HISTORY: "Do you have any history of lung disease?"  (e.g., pulmonary embolus, asthma, emphysema)     no  10. OTHER SYMPTOMS: "Do you have any other symptoms?" (e.g., runny nose, wheezing, chest pain)       Chills, nasal drainage  Protocols used: Cough - Acute Productive-A-AH

## 2023-05-02 NOTE — Patient Instructions (Addendum)
 -I sent the medication(s) we discussed to your pharmacy: Meds ordered this encounter  Medications   benzonatate (TESSALON PERLES) 100 MG capsule    Sig: 1-2 capsules up to twice daily as needed for cough.    Dispense:  30 capsule    Refill:  0   doxycycline (VIBRA-TABS) 100 MG tablet    Sig: Take 1 tablet (100 mg total) by mouth 2 (two) times daily.    Dispense:  14 tablet    Refill:  0     I hope you are feeling better soon!  Seek in person care promptly if your symptoms worsen, new concerns arise or you are not improving with treatment.  It was nice to meet you today. I help [facility] out with telemedicine visits on Tuesdays and Thursdays and am happy to help if you need a virtual follow up visit on those days. Otherwise, if you have any concerns or questions following this visit please schedule a follow up visit with your Primary Care office or seek care at a local urgent care clinic to avoid delays in care. If you are having severe or life threatening symptoms please call 911 and/or go to the nearest emergency room.    Doxycycline Capsules or Tablets What is this medication? DOXYCYCLINE (dox i SYE kleen) treats infections caused by bacteria. It belongs to a group of medications called tetracycline antibiotics. It will not treat colds, the flu, or infections caused by viruses. This medicine may be used for other purposes; ask your health care provider or pharmacist if you have questions. COMMON BRAND NAME(S): Acticlate, Adoxa, Adoxa CK, Adoxa Pak, Adoxa TT, Alodox, Avidoxy, Doxal, LYMEPAK, Mondoxyne NL, Monodox, Morgidox 1x, Morgidox 1x Kit, Morgidox 2x, Morgidox 2x Kit, NutriDox, Ocudox, [place], [place], [place], Vibra-Tabs, Vibramycin What should I tell my care team before I take this medication? They need to know if you have any of these conditions: Kidney disease Liver disease Long exposure to sunlight like working outdoors Recent stomach surgery Stomach or intestine  problems, such as colitis Vision problems Yeast or fungal infection of the mouth or vagina An unusual or allergic reaction to doxycycline, other medications, foods, dyes, or preservatives Pregnant or trying to get pregnant Breastfeeding How should I use this medication? Take this medication by mouth with water. Take it as directed on the prescription label at the same time every day. It is best to take this medication without food, but if it upsets your stomach take it with food. Take all of this medication unless your care team tells you to stop it early. Keep taking it even if you think you are better. Take antacids and products with aluminum, calcium, magnesium, iron, and zinc in them at a different time of day than this medication. Talk to your care team if you have questions. Talk to your care team about the use of this medication in children. While it may be prescribed for children for selected conditions, precautions do apply. Overdosage: If you think you have taken too much of this medicine contact a poison control center or emergency room at once. NOTE: This medicine is only for you. Do not share this medicine with others. What if I miss a dose? If you miss a dose, take it as soon as you can. If it is almost time for your next dose, take only that dose. Do not take double or extra doses. What may interact with this medication? Antacids, vitamins, or other products that contain aluminum, calcium, iron, magnesium, or  zinc Barbiturates Bismuth subsalicylate Carbamazepine Estrogen or progestin hormones Methoxyflurane Oral retinoids, such as acitretin, isotretinoin Other antibiotics Phenytoin Warfarin This list may not describe all possible interactions. Give your health care provider a list of all the medicines, herbs, non-prescription drugs, or dietary supplements you use. Also tell them if you smoke, drink alcohol, or use illegal drugs. Some items may interact with your  medicine. What should I watch for while using this medication? Tell your care team if your symptoms do not improve. Do not treat diarrhea with over the counter products. Contact your care team if you have diarrhea that lasts more than 2 days or if it is severe and watery. Do not take this medication just before going to bed. It may not dissolve properly when you lay down and can cause pain in your throat. Drink plenty of fluids while taking this medication to also help reduce irritation in your throat. This medication can make you more sensitive to the sun. Keep out of the sun. If you cannot avoid being in the sun, wear protective clothing and sunscreen. Do not use sun lamps, tanning beds, or tanning booths. Estrogen and progestin hormones may not work as well while you are taking this medication. A barrier contraceptive, such as a condom or diaphragm, is recommended if you are using these hormones for contraception. Talk to your care team about effective forms of contraception. If you are being treated for a sexually transmitted infection (STI), avoid sexual contact until you have finished your treatment. Your sexual partner may also need treatment. If you are using this medication to prevent malaria, you should still protect yourself from contact with mosquitos. Stay in screened-in areas, use mosquito nets, keep your body covered, and use an insect repellent. What side effects may I notice from receiving this medication? Side effects that you should report to your care team as soon as possible: Allergic reactions--skin rash, itching, hives, swelling of the face, lips, tongue, or throat Increased pressure around the brain--severe headache, change in vision, blurry vision, nausea, vomiting Joint pain Pain or trouble swallowing Redness, blistering, peeling, or loosening of the skin, including inside the mouth Severe diarrhea, fever Unusual vaginal discharge, itching, or odor Side effects that  usually do not require medical attention (report these to your care team if they continue or are bothersome): Change in tooth color Diarrhea Headache Heartburn Nausea This list may not describe all possible side effects. Call your doctor for medical advice about side effects. You may report side effects to FDA at 1-800-FDA-1088. Where should I keep my medication? Keep out of the reach of children and pets. Store at room temperature, below 30 degrees C (86 degrees F). Protect from light. Keep container tightly closed. Throw away any unused medication after the expiration date. Taking this medication after the expiration date can make you seriously ill. NOTE: This sheet is a summary. It may not cover all possible information. If you have questions about this medicine, talk to your doctor, pharmacist, or health care provider.  2024 Elsevier/Gold Standard (2021-11-08 00:00:00)

## 2023-05-02 NOTE — Telephone Encounter (Signed)
 We are now closed, so seeing another office today

## 2023-05-07 ENCOUNTER — Encounter: Payer: Self-pay | Admitting: Family Medicine

## 2023-05-31 ENCOUNTER — Ambulatory Visit: Payer: Self-pay

## 2023-05-31 NOTE — Telephone Encounter (Signed)
 Chief Complaint: Nose pain  Symptoms: Nose sore, crusty with drainage Frequency: Constant for 1 week  Pertinent Negatives: Patient denies fever, nausea, vomiting congestion  Disposition: [] ED /[] Urgent Care (no appt availability in office) / [x] Appointment(In office/virtual)/ []  [facility]/ [] Home Care/ [] Refused Recommended Disposition /[] [facility]/ []  Follow-up with PCP Additional Notes: Patient reports that she has small bumps inside of the nose that are crusty with drainage. Patient reports that she ha this in the past an was treated with a nasal cream. Care advice was given and patient has been scheduled for the first available at PCP per patient's request.   Copied from CRM [id number]. Topic: Clinical - Red Word Triage >> May 31, 2023  2:03 PM Geroge Baseman wrote: Red Word that prompted transfer to Nurse Triage: Patient thinks she may have impetigo inside her nose, states its sore, crusty, and oozing, She said she's been blowing her nose constantly since she was sick a few weeks ago. Noticed this beginning about two weeks ago. Reason for Disposition  [1] MODERATE pain (e.g., interferes with normal activities) AND [2] constant AND [3] present > 24 hours  Answer Assessment - Initial Assessment Questions 1. ONSET: "When did the pain start?" (e.g., minutes, hours, days)     1 week ago  2. ONSET: "Does the pain come and go, or has it been constant since it started?" (e.g., constant, intermittent, fleeting)     Constant  3. SEVERITY: "How bad is the pain?"   (Scale 1-10; mild, moderate or severe)   - MILD (1-3): doesn't interfere with normal activities    - MODERATE (4-7): interferes with normal activities or awakens from sleep    - SEVERE (8-10): excruciating pain, unable to do any normal activities      2/10 4. LOCATION: "Where does it hurt?"      Nose  5. RASH: "Is there any redness, rash, or swelling of the face?"     Yes, in the nose  6. FEVER: "Do you have a  fever?" If Yes, ask: "What is it, how was it measured, and when did it start?"      No  7. OTHER SYMPTOMS: "Do you have any other symptoms?" (e.g., fever, toothache, nasal discharge, nasal congestion, clicking sensation in jaw joint)     Crusty oozing small bumps inside the nose, nasal drainage constantly  Protocols used: Face Pain-A-AH

## 2023-06-02 NOTE — Progress Notes (Unsigned)
 No chief complaint on file.   Patient reports that she has small bumps inside of the nose that are sore, crusty and oozing.sore, crusty, and oozing.  She said she's been blowing her nose constantly since she was sick a few weeks ago. Noticed this beginning about two weeks ago.  Patient reports that she ha this in the past an was treated with a nasal cream.   (Chart reviewed, no prior rx for mupirocin noted).   PMH, PSH, SH reviewed   ROS:    PHYSICAL EXAM:  LMP 08/18/2015       ASSESSMENT/PLAN:   Needs to schedule visit with SaraBeth, last seen 05/2022

## 2023-06-03 ENCOUNTER — Ambulatory Visit: Payer: Self-pay | Admitting: Family Medicine

## 2023-06-03 ENCOUNTER — Encounter: Payer: Self-pay | Admitting: Family Medicine

## 2023-06-03 VITALS — BP 130/80 | HR 95 | Wt 128.4 lb

## 2023-06-03 DIAGNOSIS — R0989 Other specified symptoms and signs involving the circulatory and respiratory systems: Secondary | ICD-10-CM

## 2023-06-03 DIAGNOSIS — J3489 Other specified disorders of nose and nasal sinuses: Secondary | ICD-10-CM

## 2023-08-12 ENCOUNTER — Ambulatory Visit: Admitting: Family Medicine

## 2023-08-12 ENCOUNTER — Encounter: Payer: Self-pay | Admitting: Family Medicine

## 2023-08-12 VITALS — BP 120/80 | HR 109 | Wt 121.8 lb

## 2023-08-12 DIAGNOSIS — R9431 Abnormal electrocardiogram [ECG] [EKG]: Secondary | ICD-10-CM

## 2023-08-12 DIAGNOSIS — R369 Urethral discharge, unspecified: Secondary | ICD-10-CM | POA: Diagnosis not present

## 2023-08-12 DIAGNOSIS — I471 Supraventricular tachycardia, unspecified: Secondary | ICD-10-CM

## 2023-08-12 DIAGNOSIS — E7849 Other hyperlipidemia: Secondary | ICD-10-CM

## 2023-08-12 DIAGNOSIS — K50919 Crohn's disease, unspecified, with unspecified complications: Secondary | ICD-10-CM

## 2023-08-12 DIAGNOSIS — I951 Orthostatic hypotension: Secondary | ICD-10-CM | POA: Diagnosis not present

## 2023-08-12 DIAGNOSIS — R634 Abnormal weight loss: Secondary | ICD-10-CM | POA: Diagnosis not present

## 2023-08-12 DIAGNOSIS — R000 Tachycardia, unspecified: Secondary | ICD-10-CM

## 2023-08-12 LAB — POCT URINE DIPSTICK
Bilirubin, UA: NEGATIVE
Blood, UA: NEGATIVE
Glucose, UA: NEGATIVE mg/dL
Ketones, POC UA: NEGATIVE mg/dL
Nitrite, UA: NEGATIVE
POC PROTEIN,UA: NEGATIVE
Spec Grav, UA: <=1.005 — AB (ref 1.010–1.025)
Urobilinogen, UA: 0.2 U/dL
pH, UA: 7.5 (ref 5.0–8.0)

## 2023-08-12 NOTE — Patient Instructions (Signed)
 We are sending urine for testing today. If it shows infection, an antibiotic will be sent in. If you have recurrent issues, you will need to see a urologist. If there is no infection, return for a full pelvic exam so that we can evaluate the external urethra and any potential vaginal discharge.  Please read the info about cholesterol--yours was so high in 05/2022 that I suspect a hereditary cause, and it is important to get this treated before there are cardiovascular consequences/events.

## 2023-08-12 NOTE — Progress Notes (Signed)
 Chief Complaint  Patient presents with   Establish Care    UTI been going on for a few week, no burning, no frequent urination. Milky discharge. No odor. Feels heavy.  Heart Palpitations, fluttering, vision getting black when standing up, can not walk for long periods. Chest pain. Heart is off rhythm pain comes after heart flutter.    For about 2 weeks, she has noticed a discharge much more anterior in the underwear than typical vaginal discharge. She thinks it is coming from the urethra.  Sometimes the stream seems a little thinner, but more forceful. Feels air pass through the urethra when just sitting. She has fistulizing crohn's, concerned about fistula to the bladder.  Denies odor to the urine, no urinary urgency or frequency. Feels different than prior UTI's.  The discharge seems similar to BV she had in past (didn't have any odor with that infection), but doesn't seem to be in the right place in her underwear. She denies pelvic pain.  Hasn't been sexually active in years.   Since February she notices some lightheadedness after taking a hot bath.   Saturday 6/21 she got up to go to the farmer's market.  She felt her heart fluttering (like the pre-beats prior to start of SVT), vision got black, some numbness/tingling in her arms, had to steady herself. Has felt like that since Saturday morning. Yesterday, while in the kitchen she had full blown SVT--pulse felt weak, couldn't see the chest pounding like usual, and was super-fast. She tried valsalva and carotid massage, wasn't able to break it. It lasted for 2 hours. Had residual ache in her sternum after (typical after SVT for her). When she stands up now, she has pins and needle feeling underneath her arms and her neck, when things start to get black/tunnel vision.  She stays well hydrated. Denies any bleeding, or blood in stools. She is currently in a crohn's flare, and is on budesonide 3 mg, 2 tablets daily for the last 2  months. Trying to get auth for restarting her infusion  Drinks decaff coffee and tea. No  caffeine.  PSVT--diagnosed on Holter monitor about 15 years ago. She was on metoprolol  prn (couldn't take it daily due to low blood pressures)--would take it for 4-5 days if she had a couple of episodes within a week.   Cannot recall the last cardiologist she saw, not seen in at least 10 years.  Hasn't used any metoprolol  (only occurs less than once a year).    PMH, PSH, SH reviewed:  HLD (noted 05/2022, no f/u with PCP since, suggested meds, declined) Crohn's dz, paroxysmal SVT FH--father with high cholesterol, mother also Sister with hyperthyroidism Lab Results  Component Value Date   TSH 1.360 05/23/2022    ROS: no fever, chills, URI symptoms, cough, shortness of breath. Chest ache as is typical after episode of SVT. No DOE. +orthostasis.  7# weight loss in 2 mos (since at GI) 18# weight loss since 05/2022 Weight loss isn't intentional, but reports she isno longer interested in foods (related to depression) I am very depressed--financial stress, related to her ongoing crohn's flare, and inability to get the meds approved that will help. Hair loss, thinning, dry. No changes to nails.     PHYSICAL EXAM:  BP 120/80   Pulse (!) 109   Wt 121 lb 12.8 oz (55.2 kg)   LMP 08/18/2015   SpO2 99%   BMI 22.10 kg/m   Wt Readings from Last 3 Encounters:  08/12/23 121  lb 12.8 oz (55.2 kg)  06/03/23 128 lb 6.4 oz (58.2 kg)  06/12/22 139 lb 12.8 oz (63.4 kg)   Well-appearing female, in no distress.  HEENT: conjunctiva and clear are clear, EOMI. No exophthalmos. OP clear Neck: no lymphadenopathy, thyromegaly or carotid bruit Heart: tachycardia, regular rhythm.  Rate around 100  Lungs: clear bilaterally Back: no CVA or spinal tenderness Abdomen: soft, nontender, no mass Extremities: no edema Psych: depressed mood, full range of affect. Normal hygiene, grooming, eye contact and  speech.  Urine: SG <1.005, 1+ leuks, otherwise negative  EKG: sinus rhythm. Prolonged QT and anterolateral TW inversions/abnormalities.   ASSESSMENT/PLAN:  Urethral discharge - based on location of discharge in underwear. No urinary symptoms; concern for possible fistula d/t crohns. Denies vaginal d/c - Plan: POCT URINE DIPSTICK, GC/Chlamydia Probe Amp, Urine Culture  Tachycardia - tachycardic today (normal at time of EKG). Check labs, concern for hyperthyroid. May be related to dehydration (orthostatic, but urine very dilute) - Plan: Comprehensive metabolic panel with GFR, CBC with Differential/Platelet, TSH, EKG 12-Lead  Weight loss - check labs, r/o thyroid dz. May be related to depression and Crohn's flaring, little interest in eating and very particular with what she can eat - Plan: Comprehensive metabolic panel with GFR, CBC with Differential/Platelet, TSH  Orthostasis - check labs; encouraged fluid intake. Compliant with steroids for crohns. - Plan: Comprehensive metabolic panel with GFR, CBC with Differential/Platelet  Crohn's disease with complication, unspecified gastrointestinal tract location (HCC) - on chronic budesonide. Denies bleeding.  Affecting quality of life, some depression, related to poor control d/t insurance not covering effective meds - Plan: Comprehensive metabolic panel with GFR, CBC with Differential/Platelet  PSVT (paroxysmal supraventricular tachycardia) (HCC) - longstanding history, infrequent episodes. Long episode yesterday. Check labs and refer to cardiology - Plan: Ambulatory referral to Cardiology  Prolonged Q-T interval on ECG - Plan: Ambulatory referral to Cardiology  Familial hyperlipidemia, high LDL - counseled in detail about the risks, and need for treatment. Info given through MyChart to answer her questions so can further d/w her PCP - Plan: Ambulatory referral to Cardiology  Urine culture, hold off on ABX due to lack of progressive and atypical  symptoms. If +, treat, and if any recurrent syptoms will need to see uroogit to eval for poss fistula  If culture is negative, return for pelvic exam to evaluate further (?poss vaginal discharge vs urethral).  Has appt with PCP 8/1 for CPE--discussed lipids in detail, the need for meds, and to please consider treatment and d/w PCP  Refer to [facility]--pharmacy (?can help with Crohn's med), SW for depression  I spent 60 minutes dedicated to the care of this patient, including pre-visit review of records, face to face time, post-visit ordering of testing and documentation.

## 2023-08-13 ENCOUNTER — Telehealth: Payer: Self-pay

## 2023-08-13 ENCOUNTER — Ambulatory Visit: Payer: Self-pay | Admitting: Family Medicine

## 2023-08-13 LAB — COMPREHENSIVE METABOLIC PANEL WITH GFR
ALT: 21 IU/L (ref 0–32)
AST: 22 IU/L (ref 0–40)
Albumin: 4.5 g/dL (ref 3.8–4.9)
Alkaline Phosphatase: 96 IU/L (ref 44–121)
BUN/Creatinine Ratio: 12 (ref 9–23)
BUN: 8 mg/dL (ref 6–24)
Bilirubin Total: 0.5 mg/dL (ref 0.0–1.2)
CO2: 22 mmol/L (ref 20–29)
Calcium: 9.3 mg/dL (ref 8.7–10.2)
Chloride: 93 mmol/L — ABNORMAL LOW (ref 96–106)
Creatinine, Ser: 0.66 mg/dL (ref 0.57–1.00)
Globulin, Total: 2.6 g/dL (ref 1.5–4.5)
Glucose: 82 mg/dL (ref 70–99)
Potassium: 4.3 mmol/L (ref 3.5–5.2)
Sodium: 131 mmol/L — ABNORMAL LOW (ref 134–144)
Total Protein: 7.1 g/dL (ref 6.0–8.5)
eGFR: 102 mL/min/{1.73_m2} (ref >59)

## 2023-08-13 LAB — CBC WITH DIFFERENTIAL/PLATELET
Basophils Absolute: 0.1 10*3/uL (ref 0.0–0.2)
Basos: 1 %
EOS (ABSOLUTE): 0 10*3/uL (ref 0.0–0.4)
Eos: 0 %
Hematocrit: 48.9 % — ABNORMAL HIGH (ref 34.0–46.6)
Hemoglobin: 16 g/dL — ABNORMAL HIGH (ref 11.1–15.9)
Immature Grans (Abs): 0 10*3/uL (ref 0.0–0.1)
Immature Granulocytes: 0 %
Lymphocytes Absolute: 2.8 10*3/uL (ref 0.7–3.1)
Lymphs: 30 %
MCH: 32.7 pg (ref 26.6–33.0)
MCHC: 32.7 g/dL (ref 31.5–35.7)
MCV: 100 fL — ABNORMAL HIGH (ref 79–97)
Monocytes Absolute: 0.7 10*3/uL (ref 0.1–0.9)
Monocytes: 7 %
Neutrophils Absolute: 5.7 10*3/uL (ref 1.4–7.0)
Neutrophils: 62 %
Platelets: 280 10*3/uL (ref 150–450)
RBC: 4.89 x10E6/uL (ref 3.77–5.28)
RDW: 12.3 % (ref 11.7–15.4)
WBC: 9.2 10*3/uL (ref 3.4–10.8)

## 2023-08-13 LAB — GC/CHLAMYDIA PROBE AMP
Chlamydia trachomatis, NAA: NEGATIVE
Neisseria Gonorrhoeae by PCR: NEGATIVE

## 2023-08-13 LAB — URINE CULTURE

## 2023-08-13 LAB — TSH: TSH: 1.05 u[IU]/mL (ref 0.450–4.500)

## 2023-08-13 NOTE — Progress Notes (Signed)
 Complex Care Management Note  Care Guide Note 08/13/2023 Name: Autumn Robinson MRN: [id number] DOB: [birth year]  Autumn Robinson is a 58 y.o. year old female who sees Early, Camie BRAVO, NP for primary care. I reached out to Devere JONETTA Applethwaite by phone today to offer complex care management services.  Ms. Soyars was given information about Complex Care Management services today including:   The Complex Care Management services include support from the care team which includes your Nurse Care Manager, Clinical Social Worker, or Pharmacist.  The Complex Care Management team is here to help remove barriers to the health concerns and goals most important to you. Complex Care Management services are voluntary, and the patient may decline or stop services at any time by request to their care team member.   Complex Care Management Consent Status: Patient agreed to services and verbal consent obtained.   Follow up plan:  Telephone appointment with complex care management team member scheduled for:  08-26-23  Encounter Outcome:  Patient Scheduled  Leotis Rase [facility] Guide  Direct Dial: [phone]  Fax [phone]

## 2023-08-13 NOTE — Progress Notes (Signed)
 Care Guide Pharmacy Note  08/13/2023 Name: Autumn Robinson MRN: [id number] DOB: 05-28-1965  Referred By: Oris Camie BRAVO, NP Reason for referral: Complex Care Management (Initial Outreach scheduled with Angela Pharm D and Jasmine L LCSW)   Autumn Robinson is a 58 y.o. year old female who is a primary care patient of Early, Camie BRAVO, NP.  Autumn Robinson was referred to the pharmacist for assistance related to: Crohns Disease  Successful contact was made with the patient to discuss pharmacy services including being ready for the pharmacist to call at least 5 minutes before the scheduled appointment time and to have medication bottles and any blood pressure readings ready for review. The patient agreed to meet with the pharmacist via telephone visit on (date/time). 08-29-23 @ 1:00 PM  Autumn Robinson [facility] Guide  Direct Dial: [phone]  Fax [phone]

## 2023-08-15 ENCOUNTER — Ambulatory Visit

## 2023-08-15 VITALS — BP 110/80 | HR 82 | Ht 62.25 in | Wt 125.0 lb

## 2023-08-15 DIAGNOSIS — I471 Supraventricular tachycardia, unspecified: Secondary | ICD-10-CM

## 2023-08-15 DIAGNOSIS — R079 Chest pain, unspecified: Secondary | ICD-10-CM | POA: Diagnosis not present

## 2023-08-15 MED ORDER — ROSUVASTATIN CALCIUM 20 MG PO TABS: 20.0000 mg | ORAL_TABLET | Freq: Every day | ORAL | 3 refills | Status: DC

## 2023-08-15 MED ORDER — ASPIRIN 81 MG PO TBEC: 81.0000 mg | DELAYED_RELEASE_TABLET | Freq: Every day | ORAL | 3 refills | Status: AC

## 2023-08-15 MED ORDER — METOPROLOL TARTRATE 100 MG PO TABS: 100.0000 mg | ORAL_TABLET | Freq: Once | ORAL | 0 refills | Status: DC

## 2023-08-15 NOTE — Patient Instructions (Addendum)
 Medication Instructions:  Your physician has recommended you make the following change in your medication:   START: Aspirin 81 mg daily START: Crestor 20 mg daily  *If you need a refill on your cardiac medications before your next appointment, please call your pharmacy*  Lab Work: Your physician recommends that you return for lab work in:   Labs today: CBC, BMP  If you have labs (blood work) drawn today and your tests are completely normal, you will receive your results only by: MyChart Message (if you have MyChart) OR A paper copy in the mail If you have any lab test that is abnormal or we need to change your treatment, we will call you to review the results.  Testing/Procedures:  [facility] [place] [facility] DEPT OF [place].  [facility] [address] [place] [address] Dept: [phone] Loc: [phone]  Autumn Robinson  08/15/2023  You are scheduled for a Cardiac Catheterization on Tuesday, July 1 with Dr. Peter Swaziland.  1. Please arrive at the [facility] (Main Entrance A) at [facility]: [address] [place], [place] [address] at 8:30 AM (This time is 2 hour(s) before your procedure to ensure your preparation).   Free valet parking service is available. You will check in at ADMITTING. The support person will be asked to wait in the waiting room.  It is OK to have someone drop you off and come back when you are ready to be discharged.    Special note: Every effort is made to have your procedure done on time. Please understand that emergencies sometimes delay scheduled procedures.  2. Diet: Do not eat solid foods after midnight.  The patient may have clear liquids until 5am upon the day of the procedure.  3. Labs: You will need to have blood drawn on Thursday, June 26 at [facility]: [address] [place], [place] . You do not need to be fasting.  4. Medication instructions in preparation for your procedure:   Contrast  Allergy: No  On the morning of your procedure, take your Aspirin 81 mg and any morning medicines NOT listed above.  You may use sips of water.  5. Plan to go home the same day, you will only stay overnight if medically necessary. 6. Bring a current list of your medications and current insurance cards. 7. You MUST have a responsible person to drive you home. 8. Someone MUST be with you the first 24 hours after you arrive home or your discharge will be delayed. 9. Please wear clothes that are easy to get on and off and wear slip-on shoes.  Thank you for allowing us  to care for you!   --  Invasive Cardiovascular services   Follow-Up: At [facility], you and your health needs are our priority.  As part of our continuing mission to provide you with exceptional heart care, our providers are all part of one team.  This team includes your primary Cardiologist (physician) and Advanced Practice Providers or APPs (Physician Assistants and Nurse Practitioners) who all work together to provide you with the care you need, when you need it.  Your next appointment:   3 week(s)  Provider:   Alean Kobus, MD    We recommend signing up for the patient portal called MyChart.  Sign up information is provided on this After Visit Summary.  MyChart is used to connect with patients for Virtual Visits (Telemedicine).  Patients are able to  view lab/test results, encounter notes, upcoming appointments, etc.  Non-urgent messages can be sent to your provider as well.   To learn more about what you can do with MyChart, go to [email or web address].   Other Instructions None

## 2023-08-15 NOTE — Progress Notes (Signed)
 Cardiology Consultation:    Date:  08/15/2023   ID:  Autumn Robinson, DOB 07/11/1965, MRN [id number]  PCP:  Oris Camie BRAVO, NP  Cardiologist:  Alean JONELLE Kobus, MD   Referring MD: Randol Dawes, MD   Chief Complaint  Patient presents with   Tachycardia     ASSESSMENT AND PLAN:   [name] 58 year old woman seen today for cardiology consult. Her history is significant for paroxysmal SVT for which she was previously evaluated in March 2018 with Dr. Anner and reportedly had echocardiogram and heart monitor through [facility] at the time and records of this not available for me currently. She also has history of Crohn's disease, hyperlipidemia [did not want to be on statins due to concerns about side effects].   Denies any prior history of CAD, CHF, MI or CVA.  Now for the last few days starting from the weekend she has been dealing with atypical symptoms which are distinct from her typical paroxysmal SVT episodes and the fact that they are occurring with exertion and tend to subside with rest and not associated with any significant elevated heart rates.  And she had a fairly abnormal looking EKG however repeat EKG in the office today is reassuring. She continues to have symptoms.  Had extensive discussion about the next steps. My differential diagnosis is recurrent paroxysmal SVT episodes, that she has a history of in the past. However her symptoms are different than her prior typical episodes and are more consistent with exertion at suggestive of atypical angina, with the EKG changes I am concerned about unstable angina as a potential cause.  In this context given her acute ongoing symptoms, limiting her quality of life significantly over the past few days I discussed further workup for coronary artery disease.  Problem List Items Addressed This Visit     Chest pain of uncertain etiology - Primary   As discussed above with atypical anginal symptoms and occurring with exertion  and her risk factors would recommend evaluation for coronary artery disease. - Noninvasive versus invasive methods discussed including cardiac CTA coronary angiogram versus cardiac catheterization with coronary angiogram.  Timing of the test and various steps of the procedures were discussed.  Given her relatively acute ongoing symptoms with dynamic EKG changes I recommended proceeding with invasive testing at the earliest.  I recommended she avoid any moderate to heavy exertion and limit herself to day-to-day activities. If she does have noticeable increase in symptoms I recommended she had to the nearest ER right away.  Due to financial constraints and to avoid stress to her husband she was not sure and was being leaning towards a noninvasive approach with cardiac CT, finally decided to proceed with invasive approach.  Shared Decision Making/Informed Consent{ The risks [stroke (1 in 1000), death (1 in 1000), kidney failure [usually temporary] (1 in 500), bleeding (1 in 200), allergic reaction [possibly serious] (1 in 200)], benefits (diagnostic support and management of coronary artery disease) and alternatives of a cardiac catheterization were discussed in detail with her and she is willing to proceed.  Tentatively schedule at [facility] at the earliest. Advised her to start taking aspirin 81 mg once daily      Relevant Orders   EKG 12-Lead (Completed)   ECHOCARDIOGRAM COMPLETE   Basic Metabolic Panel (BMET)   CBC   Paroxysmal SVT (supraventricular tachycardia) (HCC)   She does have a history of paroxysmal SVT however her symptoms now are more acute and intense and exertional.  -  As reviewed below I will rule out any significant obstructive coronary artery disease as an etiology for her symptoms at this time. - She has agreed to proceed with cardiac catheterization as recommended. If this does not show any significant obstructive disease, will have arranged for Zio patch  monitor for 7 to 14 days and follow-up with an echocardiogram.      Relevant Medications   aspirin EC 81 MG tablet   rosuvastatin (CRESTOR) 20 MG tablet   Other Relevant Orders   EKG 12-Lead (Completed)   ECHOCARDIOGRAM COMPLETE   Basic Metabolic Panel (BMET)   CBC   Return to clinic in 3 weeks.    History of Present Illness:    Autumn Robinson is a 58 y.o. female who is being seen today for the evaluation of atypical symptoms of shortness of breath, lightheadedness at the request of Randol Dawes, MD.   Pleasant woman here for the visit by herself.  Mentions her husband accompanied her but she did not want him to come into the office  Previously seen by Dr. Anner March 2018 for evaluation of paroxysmal SVT that subsided with vagal maneuvers.  [Prior workup with echocardiogram and heart monitor is from 2014 at [facility] and I do not have those records to review] She also has history of hyperlipidemia [not on any medications], Crohn's disease. Denies any prior history of CAD, CHF, MI or CVA or prior stress test.  Now recently she had been reporting symptoms of lightheadedness and heart fluttering like sensation and felt more frequent symptoms over the past week.  Discussed this with her PCP and referred here for further evaluation.  Here in the office on discussing her symptoms she mentions since Saturday she has been having on and off symptoms of lightheadedness and vision feeling like it is going to go out, similar to what happened with her PSVT episodes in the past.  This occurred with exertion, and was intense on Saturday and Sunday all day slightly eased up into Monday.  Continues to have symptoms of shortness of breath when she walks and describes as a sensation of pain and discomfort into the neck and down the arms.  No significant symptoms at rest. She has had ongoing episodes over the last 3 days.  She describes her symptoms of palpitations in the past used to be infrequent and  short lasting and typically subside with vagal maneuvers.  Those were typically not associated with exertion.  Typically these episodes she describes are infrequent occurring once or twice every couple months. Unlike her typical SVT episodes, her recent symptoms tend to be more prominent with exertion and not associated with elevated heart rates.  EKG from PCPs office 08-11-2023 which noted sinus rhythm with T wave abnormality showing inversions when lateral leads and also precordial leads.  QTc was prolonged.  Findings more suspicious for ischemia on review of that EKG.  EKG in the clinic today in comparison shows sinus rhythm heart rate 82/min, PR interval 150 ms, QRS duration 80 ms, normal axis, T wave inversions in lateral leads.  QTc was normal 425 ms.  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 Mentions she is extremely stressed out about her health. She remains extremely emotional and in tears at various points during the visit today with concerns about her health and also about financial constraints.  Does not smoke. Does not drink alcohol. No illicit drug use.  She  has been extremely concerned about potential side effects for statins and had never been on lipid-lowering therapy despite her significant dyslipidemia.  Last lipid panel from 05/23/2022 total cholesterol 379, HDL 94, LDL 273, triglycerides 86.  Recent blood work 08/12/2023 with hemoglobin 16, hematocrit 48.9, platelets 280 TSH normal 1.05 CMP unremarkable with BUN 8, creatinine 0.66, EGFR 102. Normal transaminases and alkaline phosphatase.     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 is followed by Dr. Rosabel at [facility] in [place], [facility]   Chest pain 06/05/2011   CT chest 04/2011:  No infiltrate, no effusion, no PE EKG 2013:  No change to suggest pericarditis    Crohn's  disease (HCC)    followed by Dr. Rosabel Balder for this. 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    PSVT (paroxysmal supraventricular tachycardia) (HCC)    Diagnosed in [facility] by Dr. Oneil Glatter    Past Surgical History:  Procedure Laterality Date   ABCESS DRAINAGE  1997, 1998, and again 8 years later   multiple perianal abcesses, several surgeries   Cardiac Event Monitor  October-November 2014   Demonstrated PSVT   TRANSTHORACIC ECHOCARDIOGRAM  October 2014   Normal LV size and function, EF 60-65%. No regional wall motion abnormalities. No notable valvular lesions. No effusion.    Current Medications: Current Meds  Medication Sig   aspirin EC 81 MG tablet Take 1 tablet (81 mg total) by mouth daily. Swallow whole.   budesonide (ENTOCORT EC) 3 MG 24 hr capsule Take 6 mg by mouth daily.   Cholecalciferol (VITAMIN D  PO) Take by mouth.   estradiol  (ESTRACE ) 0.5 MG tablet TAKE 1 TABLET BY MOUTH EVERY DAY   magnesium oxide (MAG-OX) 400 MG tablet Take 400 mg by mouth daily.   medroxyPROGESTERone  (PROVERA ) 2.5 MG tablet TAKE 1 TABLET BY MOUTH EVERY DAY   Menaquinone-7 (VITAMIN K2 PO) Take by mouth.   Potassium 99 MG TABS Take 2 tablets by mouth daily.   rosuvastatin (CRESTOR) 20 MG tablet Take 1 tablet (20 mg total) by mouth daily.   [DISCONTINUED] metoprolol  tartrate (LOPRESSOR ) 100 MG tablet Take 1 tablet (100 mg total) by mouth once for 1 dose. Please take this medication 2 hours before CT.     Allergies:   Sulfa antibiotics   Social History   Socioeconomic History   Marital status: Married    Spouse name: Not on file   Number of children: Not on file   Years of education: Not on file   Highest education level: Master's degree (e.g., MA, MS, MEng, MEd,  MSW, MBA)  Occupational History   Not on file  Tobacco Use   Smoking status: Some Days    Current packs/day: 1.00    Average packs/day: 1 pack/day for 35.0 years (35.0 ttl pk-yrs)    Types: Cigarettes, E-cigarettes    Start date: 08/03/1988   Smokeless tobacco: Never  Vaping Use   Vaping status: Former  Substance and Sexual Activity   Alcohol use: No    Alcohol/week: 0.0 standard drinks of alcohol   Drug use: No   Sexual activity: Yes    Partners: Male    Birth control/protection: I.U.D.  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 She has a graduate school agree to. Smokes about one pack a day. Rare alcohol consumption. She uses and for anxiety around her apartment for medication infusion for her Crohn's.         Social Drivers of Corporate investment banker Strain: High Risk (07/10/2023)   Received from [facility]   Overall Financial Resource Strain (CARDIA)    Difficulty of Paying Living Expenses: Very hard  Food Insecurity: No Food Insecurity (07/10/2023)   Received from [facility]   Hunger Vital Sign    Within the past 12 months, you worried that your food would run out before you got the money to buy more.: Never true    Within the past 12 months, the food you bought just didn't last and you didn't have money to get more.: Never true  Transportation Needs: No Transportation Needs (07/10/2023)   Received from [facility] - Transportation    Lack of Transportation (Medical): No    Lack of Transportation (Non-Medical): No  Physical Activity: Unknown (05/02/2023)   Exercise Vital Sign    Days of Exercise per Week: 0 days    Minutes of Exercise per Session: Not on file  Stress: Stress Concern Present (05/02/2023)   [employer] of Occupational Health - Occupational Stress Questionnaire    Feeling of Stress : To some extent  Social  Connections: Moderately Isolated (05/02/2023)   Social Connection and Isolation Panel    Frequency of Communication with Friends and Family: Three times a week    Frequency of Social Gatherings with Friends and Family: Once a week    Attends Religious Services: Never    Database administrator or Organizations: No    Attends Engineer, structural: Not on file    Marital Status: Married     Family History: The patient's family history includes Allergies in her father and mother; Arthritis in her unknown relative; Bone cancer in her maternal grandfather; Cancer in her maternal grandfather and maternal grandmother; Coronary artery disease in her unknown relative; Crohn's disease in her sister; Diabetes in her father and unknown relative; Esophageal cancer in her maternal grandmother; GER disease in her mother; Heart disease in her paternal grandfather; Hiatal hernia in her mother; Hyperlipidemia in her unknown relative; Hypertension in her father, mother, and unknown relative; Macular degeneration in her maternal grandmother; Osteoporosis in her unknown relative; Stroke in her paternal grandfather. ROS:   Please see the history of present illness.    All 14 point review of systems negative except as described per history of present illness.  EKGs/Labs/Other Studies Reviewed:    The following studies were reviewed today:   EKG:  EKG Interpretation Date/Time:  Thursday August 15 2023 14:44:49 EDT Ventricular Rate:  82 PR Interval:  158 QRS Duration:  80 QT Interval:  364 QTC Calculation: 425 R Axis:   15  Text Interpretation: Normal sinus rhythm No previous ECGs available Confirmed by Liborio Hai reddy [phone]) on 08/15/2023 3:29:24 PM    Recent Labs: 08/12/2023: ALT 21; BUN 8; Creatinine, Ser 0.66; Hemoglobin 16.0; Platelets 280; Potassium 4.3; Sodium 131; TSH 1.050  Recent Lipid Panel    Component Value Date/Time   CHOL 379 (H) 05/23/2022 0936   TRIG 86 05/23/2022 0936    HDL 94 05/23/2022 0936  CHOLHDL 4.0 05/23/2022 0936   CHOLHDL 3.9 08/25/2015 0904   VLDL 14 08/25/2015 0904   LDLCALC 273 (H) 05/23/2022 0936    Physical Exam:    VS:  BP 110/80   Pulse 82   Ht 5' 2.25 (1.581 m)   Wt 125 lb (56.7 kg)   LMP 08/18/2015   SpO2 97%   BMI 22.68 kg/m     Wt Readings from Last 3 Encounters:  08/15/23 125 lb (56.7 kg)  08/12/23 121 lb 12.8 oz (55.2 kg)  06/03/23 128 lb 6.4 oz (58.2 kg)     GENERAL:  Well nourished, well developed in no acute distress NECK: No JVD; No carotid bruits CARDIAC: RRR, S1 and S2 present, no murmurs, no rubs, no gallops CHEST:  Clear to auscultation without rales, wheezing or rhonchi  Extremities: No pitting pedal edema. Pulses bilaterally symmetric with radial 2+ and dorsalis pedis 2+ NEUROLOGIC:  Alert and oriented x 3  Medication Adjustments/Labs and Tests Ordered: Current medicines are reviewed at length with the patient today.  Concerns regarding medicines are outlined above.  Orders Placed This Encounter  Procedures   Basic Metabolic Panel (BMET)   CBC   EKG 12-Lead   ECHOCARDIOGRAM COMPLETE   Meds ordered this encounter  Medications   DISCONTD: metoprolol  tartrate (LOPRESSOR ) 100 MG tablet    Sig: Take 1 tablet (100 mg total) by mouth once for 1 dose. Please take this medication 2 hours before CT.    Dispense:  1 tablet    Refill:  0   aspirin EC 81 MG tablet    Sig: Take 1 tablet (81 mg total) by mouth daily. Swallow whole.    Dispense:  90 tablet    Refill:  3   rosuvastatin (CRESTOR) 20 MG tablet    Sig: Take 1 tablet (20 mg total) by mouth daily.    Dispense:  90 tablet    Refill:  3    Signed, Chasin Findling reddy Niamya Vittitow, MD, MPH, [facility]. 08/15/2023 6:23 PM    [facility]

## 2023-08-15 NOTE — H&P (View-Only) (Signed)
 Cardiology Consultation:    Date:  08/15/2023   ID:  Autumn Robinson, DOB 07/11/1965, MRN [id number]  PCP:  Oris Camie BRAVO, NP  Cardiologist:  Alean JONELLE Kobus, MD   Referring MD: Randol Dawes, MD   Chief Complaint  Patient presents with   Tachycardia     ASSESSMENT AND PLAN:   [name] 58 year old woman seen today for cardiology consult. Her history is significant for paroxysmal SVT for which she was previously evaluated in March 2018 with Dr. Anner and reportedly had echocardiogram and heart monitor through [facility] at the time and records of this not available for me currently. She also has history of Crohn's disease, hyperlipidemia [did not want to be on statins due to concerns about side effects].   Denies any prior history of CAD, CHF, MI or CVA.  Now for the last few days starting from the weekend she has been dealing with atypical symptoms which are distinct from her typical paroxysmal SVT episodes and the fact that they are occurring with exertion and tend to subside with rest and not associated with any significant elevated heart rates.  And she had a fairly abnormal looking EKG however repeat EKG in the office today is reassuring. She continues to have symptoms.  Had extensive discussion about the next steps. My differential diagnosis is recurrent paroxysmal SVT episodes, that she has a history of in the past. However her symptoms are different than her prior typical episodes and are more consistent with exertion at suggestive of atypical angina, with the EKG changes I am concerned about unstable angina as a potential cause.  In this context given her acute ongoing symptoms, limiting her quality of life significantly over the past few days I discussed further workup for coronary artery disease.  Problem List Items Addressed This Visit     Chest pain of uncertain etiology - Primary   As discussed above with atypical anginal symptoms and occurring with exertion  and her risk factors would recommend evaluation for coronary artery disease. - Noninvasive versus invasive methods discussed including cardiac CTA coronary angiogram versus cardiac catheterization with coronary angiogram.  Timing of the test and various steps of the procedures were discussed.  Given her relatively acute ongoing symptoms with dynamic EKG changes I recommended proceeding with invasive testing at the earliest.  I recommended she avoid any moderate to heavy exertion and limit herself to day-to-day activities. If she does have noticeable increase in symptoms I recommended she had to the nearest ER right away.  Due to financial constraints and to avoid stress to her husband she was not sure and was being leaning towards a noninvasive approach with cardiac CT, finally decided to proceed with invasive approach.  Shared Decision Making/Informed Consent{ The risks [stroke (1 in 1000), death (1 in 1000), kidney failure [usually temporary] (1 in 500), bleeding (1 in 200), allergic reaction [possibly serious] (1 in 200)], benefits (diagnostic support and management of coronary artery disease) and alternatives of a cardiac catheterization were discussed in detail with her and she is willing to proceed.  Tentatively schedule at [facility] at the earliest. Advised her to start taking aspirin 81 mg once daily      Relevant Orders   EKG 12-Lead (Completed)   ECHOCARDIOGRAM COMPLETE   Basic Metabolic Panel (BMET)   CBC   Paroxysmal SVT (supraventricular tachycardia) (HCC)   She does have a history of paroxysmal SVT however her symptoms now are more acute and intense and exertional.  -  As reviewed below I will rule out any significant obstructive coronary artery disease as an etiology for her symptoms at this time. - She has agreed to proceed with cardiac catheterization as recommended. If this does not show any significant obstructive disease, will have arranged for Zio patch  monitor for 7 to 14 days and follow-up with an echocardiogram.      Relevant Medications   aspirin EC 81 MG tablet   rosuvastatin (CRESTOR) 20 MG tablet   Other Relevant Orders   EKG 12-Lead (Completed)   ECHOCARDIOGRAM COMPLETE   Basic Metabolic Panel (BMET)   CBC   Return to clinic in 3 weeks.    History of Present Illness:    Autumn Robinson is a 58 y.o. female who is being seen today for the evaluation of atypical symptoms of shortness of breath, lightheadedness at the request of Randol Dawes, MD.   Pleasant woman here for the visit by herself.  Mentions her husband accompanied her but she did not want him to come into the office  Previously seen by Dr. Anner March 2018 for evaluation of paroxysmal SVT that subsided with vagal maneuvers.  [Prior workup with echocardiogram and heart monitor is from 2014 at [facility] and I do not have those records to review] She also has history of hyperlipidemia [not on any medications], Crohn's disease. Denies any prior history of CAD, CHF, MI or CVA or prior stress test.  Now recently she had been reporting symptoms of lightheadedness and heart fluttering like sensation and felt more frequent symptoms over the past week.  Discussed this with her PCP and referred here for further evaluation.  Here in the office on discussing her symptoms she mentions since Saturday she has been having on and off symptoms of lightheadedness and vision feeling like it is going to go out, similar to what happened with her PSVT episodes in the past.  This occurred with exertion, and was intense on Saturday and Sunday all day slightly eased up into Monday.  Continues to have symptoms of shortness of breath when she walks and describes as a sensation of pain and discomfort into the neck and down the arms.  No significant symptoms at rest. She has had ongoing episodes over the last 3 days.  She describes her symptoms of palpitations in the past used to be infrequent and  short lasting and typically subside with vagal maneuvers.  Those were typically not associated with exertion.  Typically these episodes she describes are infrequent occurring once or twice every couple months. Unlike her typical SVT episodes, her recent symptoms tend to be more prominent with exertion and not associated with elevated heart rates.  EKG from PCPs office 08-11-2023 which noted sinus rhythm with T wave abnormality showing inversions when lateral leads and also precordial leads.  QTc was prolonged.  Findings more suspicious for ischemia on review of that EKG.  EKG in the clinic today in comparison shows sinus rhythm heart rate 82/min, PR interval 150 ms, QRS duration 80 ms, normal axis, T wave inversions in lateral leads.  QTc was normal 425 ms.  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 Mentions she is extremely stressed out about her health. She remains extremely emotional and in tears at various points during the visit today with concerns about her health and also about financial constraints.  Does not smoke. Does not drink alcohol. No illicit drug use.  She  has been extremely concerned about potential side effects for statins and had never been on lipid-lowering therapy despite her significant dyslipidemia.  Last lipid panel from 05/23/2022 total cholesterol 379, HDL 94, LDL 273, triglycerides 86.  Recent blood work 08/12/2023 with hemoglobin 16, hematocrit 48.9, platelets 280 TSH normal 1.05 CMP unremarkable with BUN 8, creatinine 0.66, EGFR 102. Normal transaminases and alkaline phosphatase.     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 is followed by Dr. Rosabel at [facility] in [place], [facility]   Chest pain 06/05/2011   CT chest 04/2011:  No infiltrate, no effusion, no PE EKG 2013:  No change to suggest pericarditis    Crohn's  disease (HCC)    followed by Dr. Rosabel Balder for this. 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    PSVT (paroxysmal supraventricular tachycardia) (HCC)    Diagnosed in [facility] by Dr. Oneil Glatter    Past Surgical History:  Procedure Laterality Date   ABCESS DRAINAGE  1997, 1998, and again 8 years later   multiple perianal abcesses, several surgeries   Cardiac Event Monitor  October-November 2014   Demonstrated PSVT   TRANSTHORACIC ECHOCARDIOGRAM  October 2014   Normal LV size and function, EF 60-65%. No regional wall motion abnormalities. No notable valvular lesions. No effusion.    Current Medications: Current Meds  Medication Sig   aspirin EC 81 MG tablet Take 1 tablet (81 mg total) by mouth daily. Swallow whole.   budesonide (ENTOCORT EC) 3 MG 24 hr capsule Take 6 mg by mouth daily.   Cholecalciferol (VITAMIN D  PO) Take by mouth.   estradiol  (ESTRACE ) 0.5 MG tablet TAKE 1 TABLET BY MOUTH EVERY DAY   magnesium oxide (MAG-OX) 400 MG tablet Take 400 mg by mouth daily.   medroxyPROGESTERone  (PROVERA ) 2.5 MG tablet TAKE 1 TABLET BY MOUTH EVERY DAY   Menaquinone-7 (VITAMIN K2 PO) Take by mouth.   Potassium 99 MG TABS Take 2 tablets by mouth daily.   rosuvastatin (CRESTOR) 20 MG tablet Take 1 tablet (20 mg total) by mouth daily.   [DISCONTINUED] metoprolol  tartrate (LOPRESSOR ) 100 MG tablet Take 1 tablet (100 mg total) by mouth once for 1 dose. Please take this medication 2 hours before CT.     Allergies:   Sulfa antibiotics   Social History   Socioeconomic History   Marital status: Married    Spouse name: Not on file   Number of children: Not on file   Years of education: Not on file   Highest education level: Master's degree (e.g., MA, MS, MEng, MEd,  MSW, MBA)  Occupational History   Not on file  Tobacco Use   Smoking status: Some Days    Current packs/day: 1.00    Average packs/day: 1 pack/day for 35.0 years (35.0 ttl pk-yrs)    Types: Cigarettes, E-cigarettes    Start date: 08/03/1988   Smokeless tobacco: Never  Vaping Use   Vaping status: Former  Substance and Sexual Activity   Alcohol use: No    Alcohol/week: 0.0 standard drinks of alcohol   Drug use: No   Sexual activity: Yes    Partners: Male    Birth control/protection: I.U.D.  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 She has a graduate school agree to. Smokes about one pack a day. Rare alcohol consumption. She uses and for anxiety around her apartment for medication infusion for her Crohn's.         Social Drivers of Corporate investment banker Strain: High Risk (07/10/2023)   Received from [facility]   Overall Financial Resource Strain (CARDIA)    Difficulty of Paying Living Expenses: Very hard  Food Insecurity: No Food Insecurity (07/10/2023)   Received from [facility]   Hunger Vital Sign    Within the past 12 months, you worried that your food would run out before you got the money to buy more.: Never true    Within the past 12 months, the food you bought just didn't last and you didn't have money to get more.: Never true  Transportation Needs: No Transportation Needs (07/10/2023)   Received from [facility] - Transportation    Lack of Transportation (Medical): No    Lack of Transportation (Non-Medical): No  Physical Activity: Unknown (05/02/2023)   Exercise Vital Sign    Days of Exercise per Week: 0 days    Minutes of Exercise per Session: Not on file  Stress: Stress Concern Present (05/02/2023)   [employer] of Occupational Health - Occupational Stress Questionnaire    Feeling of Stress : To some extent  Social  Connections: Moderately Isolated (05/02/2023)   Social Connection and Isolation Panel    Frequency of Communication with Friends and Family: Three times a week    Frequency of Social Gatherings with Friends and Family: Once a week    Attends Religious Services: Never    Database administrator or Organizations: No    Attends Engineer, structural: Not on file    Marital Status: Married     Family History: The patient's family history includes Allergies in her father and mother; Arthritis in her unknown relative; Bone cancer in her maternal grandfather; Cancer in her maternal grandfather and maternal grandmother; Coronary artery disease in her unknown relative; Crohn's disease in her sister; Diabetes in her father and unknown relative; Esophageal cancer in her maternal grandmother; GER disease in her mother; Heart disease in her paternal grandfather; Hiatal hernia in her mother; Hyperlipidemia in her unknown relative; Hypertension in her father, mother, and unknown relative; Macular degeneration in her maternal grandmother; Osteoporosis in her unknown relative; Stroke in her paternal grandfather. ROS:   Please see the history of present illness.    All 14 point review of systems negative except as described per history of present illness.  EKGs/Labs/Other Studies Reviewed:    The following studies were reviewed today:   EKG:  EKG Interpretation Date/Time:  Thursday August 15 2023 14:44:49 EDT Ventricular Rate:  82 PR Interval:  158 QRS Duration:  80 QT Interval:  364 QTC Calculation: 425 R Axis:   15  Text Interpretation: Normal sinus rhythm No previous ECGs available Confirmed by Liborio Hai reddy [phone]) on 08/15/2023 3:29:24 PM    Recent Labs: 08/12/2023: ALT 21; BUN 8; Creatinine, Ser 0.66; Hemoglobin 16.0; Platelets 280; Potassium 4.3; Sodium 131; TSH 1.050  Recent Lipid Panel    Component Value Date/Time   CHOL 379 (H) 05/23/2022 0936   TRIG 86 05/23/2022 0936    HDL 94 05/23/2022 0936  CHOLHDL 4.0 05/23/2022 0936   CHOLHDL 3.9 08/25/2015 0904   VLDL 14 08/25/2015 0904   LDLCALC 273 (H) 05/23/2022 0936    Physical Exam:    VS:  BP 110/80   Pulse 82   Ht 5' 2.25 (1.581 m)   Wt 125 lb (56.7 kg)   LMP 08/18/2015   SpO2 97%   BMI 22.68 kg/m     Wt Readings from Last 3 Encounters:  08/15/23 125 lb (56.7 kg)  08/12/23 121 lb 12.8 oz (55.2 kg)  06/03/23 128 lb 6.4 oz (58.2 kg)     GENERAL:  Well nourished, well developed in no acute distress NECK: No JVD; No carotid bruits CARDIAC: RRR, S1 and S2 present, no murmurs, no rubs, no gallops CHEST:  Clear to auscultation without rales, wheezing or rhonchi  Extremities: No pitting pedal edema. Pulses bilaterally symmetric with radial 2+ and dorsalis pedis 2+ NEUROLOGIC:  Alert and oriented x 3  Medication Adjustments/Labs and Tests Ordered: Current medicines are reviewed at length with the patient today.  Concerns regarding medicines are outlined above.  Orders Placed This Encounter  Procedures   Basic Metabolic Panel (BMET)   CBC   EKG 12-Lead   ECHOCARDIOGRAM COMPLETE   Meds ordered this encounter  Medications   DISCONTD: metoprolol  tartrate (LOPRESSOR ) 100 MG tablet    Sig: Take 1 tablet (100 mg total) by mouth once for 1 dose. Please take this medication 2 hours before CT.    Dispense:  1 tablet    Refill:  0   aspirin EC 81 MG tablet    Sig: Take 1 tablet (81 mg total) by mouth daily. Swallow whole.    Dispense:  90 tablet    Refill:  3   rosuvastatin (CRESTOR) 20 MG tablet    Sig: Take 1 tablet (20 mg total) by mouth daily.    Dispense:  90 tablet    Refill:  3    Signed, Chasin Findling reddy Niamya Vittitow, MD, MPH, [facility]. 08/15/2023 6:23 PM    [facility]

## 2023-08-15 NOTE — Assessment & Plan Note (Addendum)
 As discussed above with atypical anginal symptoms and occurring with exertion and her risk factors would recommend evaluation for coronary artery disease. - Noninvasive versus invasive methods discussed including cardiac CTA coronary angiogram versus cardiac catheterization with coronary angiogram.  Timing of the test and various steps of the procedures were discussed.  Given her relatively acute ongoing symptoms with dynamic EKG changes I recommended proceeding with invasive testing at the earliest.  I recommended she avoid any moderate to heavy exertion and limit herself to day-to-day activities. If she does have noticeable increase in symptoms I recommended she had to the nearest ER right away.  Due to financial constraints and to avoid stress to her husband she was not sure and was being leaning towards a noninvasive approach with cardiac CT, finally decided to proceed with invasive approach.  Shared Decision Making/Informed Consent{ The risks [stroke (1 in 1000), death (1 in 1000), kidney failure [usually temporary] (1 in 500), bleeding (1 in 200), allergic reaction [possibly serious] (1 in 200)], benefits (diagnostic support and management of coronary artery disease) and alternatives of a cardiac catheterization were discussed in detail with her and she is willing to proceed.  Tentatively schedule at [facility] at the earliest. Advised her to start taking aspirin 81 mg once daily

## 2023-08-15 NOTE — Assessment & Plan Note (Signed)
 She does have a history of paroxysmal SVT however her symptoms now are more acute and intense and exertional.  - As reviewed below I will rule out any significant obstructive coronary artery disease as an etiology for her symptoms at this time. - She has agreed to proceed with cardiac catheterization as recommended. If this does not show any significant obstructive disease, will have arranged for Zio patch monitor for 7 to 14 days and follow-up with an echocardiogram.

## 2023-08-17 ENCOUNTER — Ambulatory Visit: Payer: Self-pay

## 2023-08-17 LAB — BASIC METABOLIC PANEL WITH GFR
BUN/Creatinine Ratio: 7 — ABNORMAL LOW (ref 9–23)
BUN: 4 mg/dL — ABNORMAL LOW (ref 6–24)
CO2: 19 mmol/L — ABNORMAL LOW (ref 20–29)
Calcium: 9.2 mg/dL (ref 8.7–10.2)
Chloride: 94 mmol/L — ABNORMAL LOW (ref 96–106)
Creatinine, Ser: 0.55 mg/dL — ABNORMAL LOW (ref 0.57–1.00)
Glucose: 84 mg/dL (ref 70–99)
Potassium: 4.5 mmol/L (ref 3.5–5.2)
Sodium: 130 mmol/L — ABNORMAL LOW (ref 134–144)
eGFR: 107 mL/min/{1.73_m2} (ref >59)

## 2023-08-17 LAB — CBC
Hematocrit: 44.5 % (ref 34.0–46.6)
Hemoglobin: 15.2 g/dL (ref 11.1–15.9)
MCH: 33.8 pg — ABNORMAL HIGH (ref 26.6–33.0)
MCHC: 34.2 g/dL (ref 31.5–35.7)
MCV: 99 fL — ABNORMAL HIGH (ref 79–97)
Platelets: 274 10*3/uL (ref 150–450)
RBC: 4.5 x10E6/uL (ref 3.77–5.28)
RDW: 12.1 % (ref 11.7–15.4)
WBC: 7.3 10*3/uL (ref 3.4–10.8)

## 2023-08-19 ENCOUNTER — Telehealth: Payer: Self-pay | Admitting: *Deleted

## 2023-08-19 NOTE — Telephone Encounter (Signed)
 Cardiac Catheterization scheduled at [facility] for: Tuesday August 20, 2023 10:30 AM Arrival time [facility] Main Entrance A at: 8:30 AM  Nothing to eat after midnight prior to procedure, clear liquids until 5 AM day of procedure.  Medication instructions: -Usual morning medications can be taken with sips of water including aspirin  81 mg.  Plan to go home the same day, you will only stay overnight if medically necessary.  You must have responsible adult to drive you home.  Someone must be with you the first 24 hours after you arrive home.  Reviewed procedure instructions with patient.

## 2023-08-20 ENCOUNTER — Encounter (HOSPITAL_COMMUNITY): Payer: Self-pay | Admitting: Cardiology

## 2023-08-20 ENCOUNTER — Ambulatory Visit (HOSPITAL_COMMUNITY)
Admission: RE | Admit: 2023-08-20 | Discharge: 2023-08-20 | Disposition: A | Discharge status: Home / Self Care | Attending: Cardiology | Admitting: Cardiology

## 2023-08-20 ENCOUNTER — Other Ambulatory Visit: Payer: Self-pay

## 2023-08-20 ENCOUNTER — Encounter (HOSPITAL_COMMUNITY)
Admission: RE | Disposition: A | Discharge status: Home / Self Care | Payer: Self-pay | Source: Home / Self Care | Attending: Cardiology

## 2023-08-20 DIAGNOSIS — F17210 Nicotine dependence, cigarettes, uncomplicated: Secondary | ICD-10-CM | POA: Insufficient documentation

## 2023-08-20 DIAGNOSIS — I4719 Other supraventricular tachycardia: Secondary | ICD-10-CM | POA: Insufficient documentation

## 2023-08-20 DIAGNOSIS — K5090 Crohn's disease, unspecified, without complications: Secondary | ICD-10-CM | POA: Diagnosis not present

## 2023-08-20 DIAGNOSIS — E785 Hyperlipidemia, unspecified: Secondary | ICD-10-CM | POA: Insufficient documentation

## 2023-08-20 DIAGNOSIS — Z79899 Other long term (current) drug therapy: Secondary | ICD-10-CM | POA: Diagnosis not present

## 2023-08-20 DIAGNOSIS — R079 Chest pain, unspecified: Secondary | ICD-10-CM | POA: Diagnosis present

## 2023-08-20 DIAGNOSIS — Z7982 Long term (current) use of aspirin: Secondary | ICD-10-CM | POA: Diagnosis not present

## 2023-08-20 HISTORY — PX: LEFT HEART CATH AND CORONARY ANGIOGRAPHY: CATH118249

## 2023-08-20 SURGERY — LEFT HEART CATH AND CORONARY ANGIOGRAPHY
Anesthesia: LOCAL

## 2023-08-20 MED ORDER — SODIUM CHLORIDE 0.9 % WEIGHT BASED INFUSION: 3.0000 mL/kg/h | INTRAVENOUS | Status: AC

## 2023-08-20 MED ORDER — ASPIRIN 81 MG PO CHEW: 81.0000 mg | CHEWABLE_TABLET | Freq: Once | ORAL | Status: DC

## 2023-08-20 MED ORDER — MIDAZOLAM HCL 2 MG/2ML IJ SOLN
INTRAMUSCULAR | Status: DC | PRN
  Administered 2023-08-20: 2 mg via INTRAVENOUS

## 2023-08-20 MED ORDER — LIDOCAINE HCL (PF) 1 % IJ SOLN
INTRAMUSCULAR | Status: DC | PRN
  Administered 2023-08-20: 2 mL

## 2023-08-20 MED ORDER — HEPARIN SODIUM (PORCINE) 1000 UNIT/ML IJ SOLN
INTRAMUSCULAR | Status: DC | PRN
  Administered 2023-08-20: 3000 [IU] via INTRAVENOUS

## 2023-08-20 MED ORDER — DIAZEPAM 5 MG PO TABS
ORAL_TABLET | ORAL | Status: DC
Start: 2023-08-20 — End: 2023-08-20
  Filled 2023-08-20: qty 1

## 2023-08-20 MED ORDER — VERAPAMIL HCL 2.5 MG/ML IV SOLN
INTRAVENOUS | Status: AC
  Filled 2023-08-20: qty 2

## 2023-08-20 MED ORDER — DIAZEPAM 5 MG/ML IJ SOLN
5.0000 mg | Freq: Once | INTRAMUSCULAR | Status: AC
  Administered 2023-08-20: 5 mg via INTRAVENOUS
  Filled 2023-08-20: qty 2

## 2023-08-20 MED ORDER — HEPARIN (PORCINE) IN NACL 2-0.9 UNITS/ML
INTRAMUSCULAR | Status: DC | PRN
  Administered 2023-08-20: 10 mL via INTRA_ARTERIAL

## 2023-08-20 MED ORDER — HEPARIN (PORCINE) IN NACL 2000-0.9 UNIT/L-% IV SOLN
INTRAVENOUS | Status: DC | PRN
  Administered 2023-08-20: 1000 mL

## 2023-08-20 MED ORDER — HEPARIN SODIUM (PORCINE) 1000 UNIT/ML IJ SOLN
INTRAMUSCULAR | Status: AC
  Filled 2023-08-20: qty 10

## 2023-08-20 MED ORDER — FENTANYL CITRATE (PF) 100 MCG/2ML IJ SOLN
INTRAMUSCULAR | Status: DC | PRN
  Administered 2023-08-20: 25 ug via INTRAVENOUS

## 2023-08-20 MED ORDER — SODIUM CHLORIDE 0.9 % WEIGHT BASED INFUSION: 1.0000 mL/kg/h | INTRAVENOUS | Status: DC

## 2023-08-20 MED ORDER — MIDAZOLAM HCL 2 MG/2ML IJ SOLN
INTRAMUSCULAR | Status: AC
  Filled 2023-08-20: qty 2

## 2023-08-20 MED ORDER — LIDOCAINE HCL (PF) 1 % IJ SOLN
INTRAMUSCULAR | Status: AC
  Filled 2023-08-20: qty 30

## 2023-08-20 MED ORDER — FENTANYL CITRATE (PF) 100 MCG/2ML IJ SOLN
INTRAMUSCULAR | Status: AC
  Filled 2023-08-20: qty 2

## 2023-08-20 MED ORDER — IOHEXOL 350 MG/ML SOLN
INTRAVENOUS | Status: DC | PRN
  Administered 2023-08-20: 23 mL

## 2023-08-20 SURGICAL SUPPLY — 7 items
CATH 5FR JL3.5 JR4 ANG PIG MP (CATHETERS) IMPLANT
COVER PRB 48X5XTLSCP FOLD TPE (BAG) IMPLANT
DEVICE RAD COMP TR BAND LRG (VASCULAR PRODUCTS) IMPLANT
GLIDESHEATH SLEND SS 6F .021 (SHEATH) IMPLANT
GUIDEWIRE INQWIRE 1.5J.035X260 (WIRE) IMPLANT
PACK CARDIAC CATHETERIZATION (CUSTOM PROCEDURE TRAY) ×1 IMPLANT
SET ATX-X65L (MISCELLANEOUS) IMPLANT

## 2023-08-20 NOTE — Interval H&P Note (Signed)
 History and Physical Interval Note:  08/20/2023 12:31 PM  Autumn Robinson  has presented today for surgery, with the diagnosis of chest pain.  The various methods of treatment have been discussed with the patient and family. After consideration of risks, benefits and other options for treatment, the patient has consented to  Procedure(s): LEFT HEART CATH AND CORONARY ANGIOGRAPHY (N/A) as a surgical intervention.  The patient's history has been reviewed, patient examined, no change in status, stable for surgery.  I have reviewed the patient's chart and labs.  Questions were answered to the patient's satisfaction.   Cath Lab Visit (complete for each Cath Lab visit)  Clinical Evaluation Leading to the Procedure:   ACS: Yes.    Non-ACS:    Anginal Classification: CCS II  Anti-ischemic medical therapy: No Therapy  Non-Invasive Test Results: No non-invasive testing performed  Prior CABG: No previous CABG        [facility] 08/20/2023 12:31 PM

## 2023-08-20 NOTE — Progress Notes (Signed)
 Tr band removed at 1630, gauze dressing applied. Right radial level 0, clean, dry, and intact.

## 2023-08-20 NOTE — Discharge Instructions (Signed)

## 2023-08-26 ENCOUNTER — Other Ambulatory Visit: Payer: Self-pay | Admitting: Licensed Clinical Social Worker

## 2023-08-26 NOTE — Patient Outreach (Signed)
 Complex Care Management   Visit Note  08/26/2023  Name:  Autumn Robinson MRN: [id number] DOB: 08-29-1965  Situation: Referral received for Complex Care Management related to Stress I obtained verbal consent from Patient.  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 is followed by Dr. Rosabel at [facility] in [place], [facility]   Chest pain 06/05/2011   CT chest 04/2011:  No infiltrate, no effusion, no PE EKG 2013:  No change to suggest pericarditis    Crohn's disease (HCC)    followed by Dr. Rosabel Balder for this. 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    PSVT (paroxysmal supraventricular tachycardia) (HCC)    Diagnosed in [facility] by Dr. Oneil Glatter    Assessment: Patient Reported Symptoms:  Cognitive Cognitive Status: No symptoms reported, Alert and oriented to person, place, and time, Normal speech and language skills Cognitive/Intellectual Conditions Management [RPT]: None reported or documented in medical history or problem list   Health Maintenance Behaviors: Annual physical exam  Neurological Neurological Review of Symptoms: Dizziness Neurological Management Strategies: Medication therapy, Routine screening  HEENT HEENT Symptoms Reported: No symptoms reported      Cardiovascular Cardiovascular Symptoms Reported: Dizziness (when I stand) Does patient have uncontrolled Hypertension?: No Cardiovascular Management Strategies: Coping strategies, Routine screening, Medication therapy Cardiovascular Self-Management Outcome: 2 (bad)  Respiratory Respiratory Symptoms Reported: Shortness of breath Additional Respiratory Details:  Due to heart conditions. It will go away with rest Respiratory Management Strategies: Adequate rest, Routine screening  Endocrine Endocrine Symptoms Reported: No symptoms reported Is patient diabetic?: No    Gastrointestinal Gastrointestinal Symptoms Reported: No symptoms reported      Genitourinary Genitourinary Symptoms Reported: No symptoms reported    Integumentary Integumentary Symptoms Reported: No symptoms reported    Musculoskeletal Musculoskelatal Symptoms Reviewed: No symptoms reported   Falls in the past year?: No Number of falls in past year: 1 or less Was there an injury with Fall?: No Fall Risk Category Calculator: 0 Patient Fall Risk Level: Low Fall Risk    Psychosocial Psychosocial Symptoms Reported: Depression - if selected complete PHQ 2-9, Anxiety - if selected complete GAD Additional Psychological Details: Patient identified multiple stressors that have resulted in anxiety and depression symptoms. Encouragement and validation provided. Healthy coping skills discussed, in addition, to strategies to strengthen support system and promote increased well-being Behavioral Management Strategies: Adequate rest, Coping strategies, Support system Major Change/Loss/Stressor/Fears (CP): Medical condition, self Behaviors When Feeling Stressed/Fearful: Withdrawn Techniques to Cope with Loss/Stress/Change: Diversional activities, Spiritual practice(s), Withdraw Quality of Family Relationships: involved, supportive Do you feel physically threatened by others?: No      05/23/2022    8:30 AM  Depression screen PHQ 2/9  Decreased Interest 0  Down, Depressed, Hopeless 0  PHQ - 2 Score 0    There were no vitals filed for this visit.  Medications Reviewed Today     Reviewed by Oluwasemilore Pascuzzi D, LCSW (Social Worker) on 08/26/23 at 1051  Med List Status: <None>   Medication Order Taking? Sig Documenting Provider Last Dose Status Informant  acetaminophen  (TYLENOL ) 325 MG tablet  [id number] Yes Take 325-650  mg by mouth every 6 (six) hours as needed (pain.). [provider]  Active Self  aspirin  EC 325 MG tablet [id number] Yes Take 650 mg by mouth 2 (two) times daily as needed (pain.). [provider]  Active Self  aspirin  EC 81 MG tablet [id number] Yes Take 1 tablet (81 mg total) by mouth daily. Swallow whole. Madireddy, Alean SAUNDERS, MD  Active Self  budesonide (ENTOCORT EC) 3 MG 24 hr capsule [id number] Yes Take 6 mg by mouth in the morning. [provider]  Active Self           Med Note MAPLE, CARLO JAYSON Kitchens Aug 12, 2023 10:03 AM)    Cholecalciferol (VITAMIN D  PO) [id number] Yes Take 1 tablet by mouth every evening. [provider]  Active Self  dextromethorphan (DELSYM) 30 MG/5ML liquid [id number] Yes Take by mouth as needed for cough. [provider]  Active Self  estradiol  (ESTRACE ) 0.5 MG tablet [id number] Yes TAKE 1 TABLET BY MOUTH EVERY DAY Early, Sara E, NP  Active Self  ibuprofen  (ADVIL ) 200 MG tablet [id number] Yes Take 400 mg by mouth every 6 (six) hours as needed (pain.). [provider]  Active Self  loperamide (IMODIUM) 2 MG capsule [id number] Yes Take 2 mg by mouth 4 (four) times daily as needed for diarrhea or loose stools. [provider]  Active Self  loratadine (CLARITIN) 10 MG tablet [id number] Yes Take 10 mg by mouth daily. [provider]  Active Self  MAGNESIUM OXIDE PO [id number] Yes Take 100 mg by mouth every evening. [provider]  Active Self  medroxyPROGESTERone  (PROVERA ) 2.5 MG tablet [id number] Yes TAKE 1 TABLET BY MOUTH EVERY DAY Early, Sara E, NP  Active Self  Menaquinone-7 (VITAMIN K2 PO) [id number] Yes Take 1 tablet by mouth daily with supper. [provider]  Active Self  Multiple Vitamin (MULTIVITAMIN WITH MINERALS) TABS tablet [id number] Yes Take 1 tablet by mouth daily with supper. Women's One Daily Multivitamin by [employer] [provider]  Active Self   naproxen sodium (ALEVE) 220 MG tablet [id number] Yes Take 220 mg by mouth 2 (two) times daily as needed (severe pain.). [provider]  Active Self  Potassium 99 MG TABS [id number] Yes Take 198 mg by mouth daily as needed (muscle cramping/pain.). [provider]  Active Self  rosuvastatin  (CRESTOR ) 20 MG tablet [id number] Yes Take 1 tablet (20 mg total) by mouth daily. Madireddy, Alean SAUNDERS, MD  Active Self  vitamin C (ASCORBIC ACID) 250 MG tablet [id number] Yes Take 250 mg by mouth every evening. [provider]  Active Self            Recommendation:   Continue Current Plan of Care  Follow Up Plan:   Telephone follow-up in 1 week  Rolin Kerns, LCSW [facility]  [facility], [facility] Clinical Social Worker Direct Dial: [phone]  Fax: [phone] Website: [email or web address] 11:57 AM

## 2023-08-26 NOTE — Patient Instructions (Signed)
 Visit Information  Thank you for taking time to visit with me today. Please don't hesitate to contact me if I can be of assistance to you before our next scheduled appointment.  Our next appointment is by telephone on 07/18 at 3 PM Please call the care guide team at [phone] if you need to cancel or reschedule your appointment.   Following is a copy of your care plan:   Goals Addressed             This Visit's Progress    LCSW VBCI Social Work Care Plan   On track    Problems:   Disease Management support and education needs related to Stress at managing chronic health conditions  CSW Clinical Goal(s):   Over the next 90 days the Patient will attend all scheduled medical appointments as evidenced by patient report and care team review of appointment completion in electronic MEDICAL RECORD NUMBER  demonstrate a reduction in symptoms related to Stress Management .  Interventions:  Mental Health:  Evaluation of current treatment plan related to Stress Management Active listening / Reflection utilized Financial risk analyst / information provided Emotional Support Provided Mindfulness or Relaxation training provided Provided general psycho-education for mental health needs Solution-Focued Strategies employed:  Patient Goals/Self-Care Activities:  Continue taking your medication as prescribed.   Increase coping skills, healthy habits, and stress reduction  Plan:   Telephone follow up appointment with care management team member scheduled for:  2-4 weeks        Please call the Suicide and Crisis Lifeline: 988 go to [facility] Urgent [facility] [address], [place] [phone]) call 911 if you are experiencing a Mental Health or Behavioral Health Crisis or need someone to talk to.  Patient verbalizes understanding of instructions and care plan provided today and agrees to view in MyChart. Active MyChart status and patient understanding of how  to access instructions and care plan via MyChart confirmed with patient.     Rolin Ezzard HUGHS [facility]  [facility], [facility] Clinical Social Worker Direct Dial: [phone]  Fax: [phone] Website: [email or web address] 11:57 AM

## 2023-08-29 ENCOUNTER — Other Ambulatory Visit

## 2023-08-29 ENCOUNTER — Ambulatory Visit

## 2023-08-29 DIAGNOSIS — Z79899 Other long term (current) drug therapy: Secondary | ICD-10-CM

## 2023-08-29 NOTE — Progress Notes (Signed)
 08/29/2023 Name: Autumn Robinson MRN: [id number] DOB: 1966/01/20  Chief Complaint  Patient presents with   Medication Management    Autumn Robinson is a 58 y.o. year old female who presented for a telephone visit.   They were referred to the pharmacist by their PCP for assistance in managing complex medication management.    Subjective:  Care Team: Primary Care Provider: Oris Camie BRAVO, NP  Medication Access/Adherence  Current Pharmacy:  [facility] [phone] [address], [place] - [phone] AT [facility] [address]  [place] [address] Phone: [phone] Fax: [phone]  [id number] [place], [place] - [address] [place] [address] Phone: [phone] Fax: [phone]   Patient reports affordability concerns with their medications: Yes  - trying to get insurance approval for Remicade injections Patient reports access/transportation concerns to their pharmacy: No  Patient reports adherence concerns with their medications:  No     Medication Management: -Patient's primary medication concerns today are trying to get approval for her Remicade and the safety of long term use of steroids -Per patient, insurance denied remicade coverage but appeal is pending, hoping for an update from gastro any day now. If denied, patient is wondering if there is any type of assistance for patient's to get remicade with no insurance coverage. -Has been on budesonide 2 capsules chronically for approximately 2 months in the interim and worried about the impact that may have on her body    Objective:  Lab Results  Component Value Date   HGBA1C 5.2 05/23/2022    Lab Results  Component Value Date   CREATININE 0.55 (L) 08/16/2023   BUN 4 (L) 08/16/2023   NA 130 (L) 08/16/2023   K 4.5 08/16/2023   CL 94 (L) 08/16/2023   CO2 19 (L) 08/16/2023    Lab Results  Component Value Date   CHOL 379 (H) 05/23/2022   HDL 94  05/23/2022   LDLCALC 273 (H) 05/23/2022   TRIG 86 05/23/2022   CHOLHDL 4.0 05/23/2022    Medications Reviewed Today     Reviewed by Lionell Jon DEL, RPH (Pharmacist) on 08/29/23 at 1440  Med List Status: <None>   Medication Order Taking? Sig Documenting Provider Last Dose Status Informant  acetaminophen  (TYLENOL ) 325 MG tablet [id number]  Take 325-650 mg by mouth every 6 (six) hours as needed (pain.). [provider]  Active Self  aspirin  EC 325 MG tablet [id number]  Take 650 mg by mouth 2 (two) times daily as needed (pain.). [provider]  Active Self  aspirin  EC 81 MG tablet [id number]  Take 1 tablet (81 mg total) by mouth daily. Swallow whole. Madireddy, Alean SAUNDERS, MD  Active Self  budesonide (ENTOCORT EC) 3 MG 24 hr capsule [id number]  Take 6 mg by mouth in the morning. [provider]  Active Self           Med Note MAPLE, CARLO JAYSON Kitchens Aug 12, 2023 10:03 AM)    Cholecalciferol (VITAMIN D  PO) [id number]  Take 1 tablet by mouth every evening. [provider]  Active Self  dextromethorphan (DELSYM) 30 MG/5ML liquid [id number]  Take by mouth as needed for cough. [provider]  Active Self  estradiol  (ESTRACE ) 0.5 MG tablet [id number]  TAKE 1 TABLET BY MOUTH EVERY DAY Early, Sara E, NP  Active Self  ibuprofen  (ADVIL ) 200 MG tablet [id number]  Take 400 mg by mouth every 6 (six)  hours as needed (pain.). [provider]  Active Self  loperamide (IMODIUM) 2 MG capsule [id number]  Take 2 mg by mouth 4 (four) times daily as needed for diarrhea or loose stools. [provider]  Active Self  loratadine (CLARITIN) 10 MG tablet [id number]  Take 10 mg by mouth daily. [provider]  Active Self  MAGNESIUM OXIDE PO [id number]  Take 100 mg by mouth every evening. [provider]  Active Self  medroxyPROGESTERone  (PROVERA ) 2.5 MG tablet [id number]  TAKE 1 TABLET BY MOUTH EVERY DAY Early, Sara E, NP  Active Self  Menaquinone-7  (VITAMIN K2 PO) [id number]  Take 1 tablet by mouth daily with supper. [provider]  Active Self  Multiple Vitamin (MULTIVITAMIN WITH MINERALS) TABS tablet [id number]  Take 1 tablet by mouth daily with supper. Women's One Daily Multivitamin by [employer] [provider]  Active Self  naproxen sodium (ALEVE) 220 MG tablet [id number]  Take 220 mg by mouth 2 (two) times daily as needed (severe pain.). [provider]  Active Self  Potassium 99 MG TABS [id number]  Take 198 mg by mouth daily as needed (muscle cramping/pain.). [provider]  Active Self  rosuvastatin  (CRESTOR ) 20 MG tablet [id number]  Take 1 tablet (20 mg total) by mouth daily. Madireddy, Alean SAUNDERS, MD  Active Self  vitamin C (ASCORBIC ACID) 250 MG tablet [id number]  Take 250 mg by mouth every evening. [provider]  Active Self              Assessment/Plan:   Medication Management: -Plan to research any fund assistance for remicade, did make patient aware that most fund/grants only cover copays once insurance picks up a portion but will attempt to find options. -Counseled on risks of long term steroid use. Counseled that if remicade continues to be denied and no assistance available, would plan to discuss other alternatives with gastro -Counseled on foods/hydration to keep sodium up. Reviewed med list for any medications that may cause hyponatremia and none would directly contribute, likely due to Crohn's and patient's levels typically run around 130-135 upon review.    Follow Up Plan: 09/26/23  Jon VEAR [place], PharmD Clinical Pharmacist [phone]

## 2023-09-02 ENCOUNTER — Ambulatory Visit

## 2023-09-02 DIAGNOSIS — I517 Cardiomegaly: Secondary | ICD-10-CM

## 2023-09-02 DIAGNOSIS — I471 Supraventricular tachycardia, unspecified: Secondary | ICD-10-CM | POA: Diagnosis not present

## 2023-09-02 DIAGNOSIS — R079 Chest pain, unspecified: Secondary | ICD-10-CM

## 2023-09-04 ENCOUNTER — Other Ambulatory Visit: Payer: Self-pay

## 2023-09-04 DIAGNOSIS — T7840XA Allergy, unspecified, initial encounter: Secondary | ICD-10-CM | POA: Insufficient documentation

## 2023-09-04 LAB — ECHOCARDIOGRAM COMPLETE
AR max vel: 1.75 cm2
AV Area VTI: 2 cm2
AV Area mean vel: 1.93 cm2
AV Mean grad: 5.1 mmHg
AV Peak grad: 10.5 mmHg
Ao pk vel: 1.62 m/s
Area-P 1/2: 3.26 cm2
MV VTI: 2.11 cm2
S' Lateral: 2.2 cm

## 2023-09-05 ENCOUNTER — Ambulatory Visit

## 2023-09-05 ENCOUNTER — Telehealth: Payer: Self-pay

## 2023-09-05 VITALS — BP 120/60 | HR 88 | Ht 62.0 in | Wt 124.0 lb

## 2023-09-05 DIAGNOSIS — I471 Supraventricular tachycardia, unspecified: Secondary | ICD-10-CM

## 2023-09-05 DIAGNOSIS — E782 Mixed hyperlipidemia: Secondary | ICD-10-CM | POA: Diagnosis not present

## 2023-09-05 NOTE — Patient Instructions (Signed)
 Medication Instructions:  Your physician recommends that you continue on your current medications as directed. Please refer to the Current Medication list given to you today.  *If you need a refill on your cardiac medications before your next appointment, please call your pharmacy*  Lab Work: None If you have labs (blood work) drawn today and your tests are completely normal, you will receive your results only by: MyChart Message (if you have MyChart) OR A paper copy in the mail If you have any lab test that is abnormal or we need to change your treatment, we will call you to review the results.  Testing/Procedures: A zio monitor was ordered today. It will remain on for 14 days. You will then return monitor and event diary in provided box. It takes 1-2 weeks for report to be downloaded and returned to us . We will call you with the results. If monitor falls off or has orange flashing light, please call Zio for further instructions.   Follow-Up: At [facility], you and your health needs are our priority.  As part of our continuing mission to provide you with exceptional heart care, our providers are all part of one team.  This team includes your primary Cardiologist (physician) and Advanced Practice Providers or APPs (Physician Assistants and Nurse Practitioners) who all work together to provide you with the care you need, when you need it.  Your next appointment:   3 month(s)  Provider:   Alean Kobus, MD    We recommend signing up for the patient portal called MyChart.  Sign up information is provided on this After Visit Summary.  MyChart is used to connect with patients for Virtual Visits (Telemedicine).  Patients are able to view lab/test results, encounter notes, upcoming appointments, etc.  Non-urgent messages can be sent to your provider as well.   To learn more about what you can do with MyChart, go to [email or web address].   Other Instructions None

## 2023-09-05 NOTE — Progress Notes (Signed)
   09/05/2023  Patient ID: Autumn Robinson, female   DOB: [birth year], 58 y.o.   MRN: [id number]  Contacted patient to follow up on Remicade assistance. Unfortunately, all assistance programs at this time require insurance to be paying for some portion of the cost before approving any funding.  Notified patient. Her insurance has denied the appeal for the Remicade product. She is in discussions with gastro as to whether or not a peer to peer review can be done to override this denial. If not, will discuss alternatives with them.  Plan to keep our scheduled follow up call in Aug to touch base following her appt with gastro.  Jon VEAR [place], PharmD Clinical Pharmacist [phone]

## 2023-09-05 NOTE — Assessment & Plan Note (Signed)
 Lipid panel 05/23/2022 total cholesterol 379, HDL 94, LDL 273, triglycerides 86. Reviewed potential risk for cardiovascular disease due to progressive atherosclerosis given these lipid levels. She however mentions multiple family members have this and none of them have had heart attacks or heart disease.  She does not want to be on statins or other lipid-lowering therapy at this time.  Recommended strongly to address her diet.  Will consider reevaluating lipid levels at subsequent follow-up visits if she is agreeable and consider alternative lipid-lowering agents such as Zetia or bempedoic acid, however her significant financial barriers were also limitation for which she wants to avoid medications.SABRA

## 2023-09-05 NOTE — Progress Notes (Signed)
 Cardiology Consultation:    Date:  09/05/2023   ID:  Autumn Robinson, DOB 08-08-1965, MRN [id number]  PCP:  Oris Camie BRAVO, NP  Cardiologist:  Alean JONELLE Kobus, MD   Referring MD: Oris Camie BRAVO, NP   Chief Complaint  Patient presents with   Follow-up     ASSESSMENT AND PLAN:   Ms. Quintela 58 year old woman with history of paroxysmal SVT diagnosed back in 2018, Crohn's disease, hyperlipidemia does not want to be on statins or lipid-lowering therapy. Normal coronary angiogram on cardiac cath 08/20/2023. Echocardiogram from 09/02/2023 is reassuring with normal biventricular function and no significant valve abnormalities.   Problem List Items Addressed This Visit     Paroxysmal SVT (supraventricular tachycardia) (HCC)   In the setting of recurrent episodes of paroxysmal SVT in the past and now with palpitations suggestive of similar episodes we will proceed with Zio patch for 14 days.  Will hold off on starting any beta-blockers at this time as she reports heart rates at times tend to be low. If heart rates are within normal range and she does have episodes of paroxysmal SVT will start a low-dose beta-blocker such as metoprolol  and she is agreeable with this.       Relevant Orders   LONG TERM MONITOR XT (3-14 DAYS)   HLD (hyperlipidemia) - Primary   Lipid panel 05/23/2022 total cholesterol 379, HDL 94, LDL 273, triglycerides 86. Reviewed potential risk for cardiovascular disease due to progressive atherosclerosis given these lipid levels. She however mentions multiple family members have this and none of them have had heart attacks or heart disease.  She does not want to be on statins or other lipid-lowering therapy at this time.  Recommended strongly to address her diet.  Will consider reevaluating lipid levels at subsequent follow-up visits if she is agreeable and consider alternative lipid-lowering agents such as Zetia or bempedoic acid, however her significant financial barriers  were also limitation for which she wants to avoid medications..       Return to clinic tentatively in 3 months.   History of Present Illness:    Autumn Robinson is a 58 y.o. female who is being seen today for follow-up visit. PCP is Early, Camie BRAVO, NP. Last visit with me in the office was 08/15/2023.  Has a history of paroxysmal SVT diagnosed back in March 2018, Crohn's disease, hyperlipidemia [does not want to be on statins, mentions multiple family members with high cholesterol but no heart disease and had side effects with statins and hence she prefers not to take cholesterol-lowering medicines at this time], anxiety and stress. No prior history of CAD, CHF, MI, CVA. She had an episode of paroxysmal SVT like symptoms prior to her visit with me.  She had fairly abnormal looking EKG at the time.  Follow-up EKG was reassuring.  With her significant ongoing chest pain symptoms discussed further evaluation for CAD with cardiac CT versus cardiac cath. Eventually underwent cardiac cath 08/20/2023 that noted normal coronaries, normal LVEDP.  Echocardiogram 09/02/2023 noted normal biventricular function with LVEF 60 to 65%, mild LVH, normal diastolic function, no significant valve dysfunction.  Here for the visit today.  Mentions overall doing fine.  Denies any major episodes but continues to feel fluttering in the chest intermittently. Right forearm cath access site bruised and appears yellow mild discomfort at the cath access site still reported but no obvious swelling or tenderness.  She is reassured about the findings on cath and echocardiogram. She does not  want to take statins.   Past Medical History:  Diagnosis Date   Allergy    seasonal   Chest pain 06/05/2011   CT chest 04/2011:  No infiltrate, no effusion, no PE EKG 2013:  No change to suggest pericarditis    Chest pain of uncertain etiology 06/05/2011   CT chest 04/2011:  No infiltrate, no effusion, no PE  EKG 2013:  No change to  suggest pericarditis     Chronic bilateral low back pain without sciatica 05/23/2022   Crohn's disease (HCC)    followed by Dr. Rosabel Balder for this. visit from 10/12/2015 scanned into chart   High risk medication use 05/30/2022   HLD (hyperlipidemia) 12/17/2012   IUD (intrauterine device) in place 08/26/2013   paraguard 07/2013  Gyn Inocente Salt, NP     OSA (obstructive sleep apnea)    mild per sleep study.    Paroxysmal SVT (supraventricular tachycardia) (HCC) 08/10/2013   Plantar wart of left foot 05/30/2022   Routine general medical examination at a health care facility 08/25/2015   Statin declined 04/14/2018    Past Surgical History:  Procedure Laterality Date   ABCESS DRAINAGE  1997, 1998, and again 8 years later   multiple perianal abcesses, several surgeries   Cardiac Event Monitor  October-November 2014   Demonstrated PSVT   LEFT HEART CATH AND CORONARY ANGIOGRAPHY N/A 08/20/2023   Procedure: LEFT HEART CATH AND CORONARY ANGIOGRAPHY;  Surgeon: Sw[place]il[place], Peter M, MD;  Location: [facility] INVASIVE CV LAB;  Service: Cardiovascular;  Laterality: N/A;   TRANSTHORACIC ECHOCARDIOGRAM  October 2014   Normal LV size and function, EF 60-65%. No regional wall motion abnormalities. No notable valvular lesions. No effusion.    Current Medications: Current Meds  Medication Sig   acetaminophen  (TYLENOL ) 325 MG tablet Take 325-650 mg by mouth every 6 (six) hours as needed (pain.).   aspirin  EC 325 MG tablet Take 650 mg by mouth 2 (two) times daily as needed (pain.).   aspirin  EC 81 MG tablet Take 1 tablet (81 mg total) by mouth daily. Swallow whole.   budesonide (ENTOCORT EC) 3 MG 24 hr capsule Take 6 mg by mouth in the morning. (Patient taking differently: Take 6 mg by mouth as needed (crohns disease).)   Cholecalciferol (VITAMIN D  PO) Take 1 tablet by mouth every evening.   dextromethorphan (DELSYM) 30 MG/5ML liquid Take by mouth as needed for cough.   estradiol  (ESTRACE ) 0.5 MG tablet TAKE 1  TABLET BY MOUTH EVERY DAY   ibuprofen  (ADVIL ) 200 MG tablet Take 400 mg by mouth every 6 (six) hours as needed (pain.).   loperamide (IMODIUM) 2 MG capsule Take 2 mg by mouth 4 (four) times daily as needed for diarrhea or loose stools.   loratadine (CLARITIN) 10 MG tablet Take 10 mg by mouth daily.   MAGNESIUM OXIDE PO Take 100 mg by mouth every evening.   medroxyPROGESTERone  (PROVERA ) 2.5 MG tablet TAKE 1 TABLET BY MOUTH EVERY DAY   Menaquinone-7 (VITAMIN K2 PO) Take 1 tablet by mouth daily with supper.   Multiple Vitamin (MULTIVITAMIN WITH MINERALS) TABS tablet Take 1 tablet by mouth daily with supper. Women's One Daily Multivitamin by [employer]   naproxen sodium (ALEVE) 220 MG tablet Take 220 mg by mouth 2 (two) times daily as needed (severe pain.).   Potassium 99 MG TABS Take 198 mg by mouth daily as needed (muscle cramping/pain.).   vitamin C (ASCORBIC ACID) 250 MG tablet Take 250 mg by mouth every evening.  Allergies:   Sulfa antibiotics   Social History   Socioeconomic History   Marital status: Married    Spouse name: Not on file   Number of children: Not on file   Years of education: Not on file   Highest education level: Master's degree (e.g., MA, MS, MEng, MEd, MSW, MBA)  Occupational History   Not on file  Tobacco Use   Smoking status: Some Days    Current packs/day: 1.00    Average packs/day: 1 pack/day for 35.1 years (35.1 ttl pk-yrs)    Types: Cigarettes, E-cigarettes    Start date: 08/03/1988   Smokeless tobacco: Never  Vaping Use   Vaping status: Former  Substance and Sexual Activity   Alcohol use: No    Alcohol/week: 0.0 standard drinks of alcohol   Drug use: No   Sexual activity: Yes    Partners: Male    Birth control/protection: I.U.D.    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 She has a graduate school agree to. Smokes about one pack a  day. Rare alcohol consumption. She uses and for anxiety around her apartment for medication infusion for her Crohn's.         Social Drivers of Health   Financial Resource Strain: High Risk (07/10/2023)   [facility]   Overall Financial Resource Strain (CARDIA)    Difficulty of Paying Living Expenses: Very hard  Food Insecurity: Food Insecurity Present (08/26/2023)   Hunger Vital Sign    Worried About Running Out of Food in the Last Year: Sometimes true    Ran Out of Food in the Last Year: Sometimes true  Transportation Needs: No Transportation Needs (08/26/2023)   PRAPARE - Administrator, Civil Service (Medical): No    Lack of Transportation (Non-Medical): No  Physical Activity: Unknown (05/02/2023)   Exercise Vital Sign    Days of Exercise per Week: 0 days    Minutes of Exercise per Session: Not on file  Stress: Stress Concern Present (05/02/2023)   [employer] of Occupational Health - Occupational Stress Questionnaire    Feeling of Stress : To some extent  Social Connections: Moderately Isolated (05/02/2023)   Social Connection and Isolation Panel    Frequency of Communication with Friends and Family: Three times a week    Frequency of Social Gatherings with Friends and Family: Once a week    Attends Religious Services: Never    Database administrator or Organizations: No    Attends Engineer, structural: Not on file    Marital Status: Married     Family History: The patient's family history includes Allergies in her father and mother; Arthritis in her unknown relative; Bone cancer in her maternal grandfather; Cancer in her maternal grandfather and maternal grandmother; Coronary artery disease in her unknown relative; Crohn's disease in her sister; Diabetes in her father and unknown relative; Esophageal cancer in her maternal grandmother; GER disease in her mother; Heart disease in her paternal grandfather; Hiatal hernia in her mother;  Hyperlipidemia in her unknown relative; Hypertension in her father, mother, and unknown relative; Macular degeneration in her maternal grandmother; Osteoporosis in her unknown relative; Stroke in her paternal grandfather. ROS:   Please see the history of present illness.    All 14 point review of systems negative except as described per history of  present illness.  EKGs/Labs/Other Studies Reviewed:    The following studies were reviewed today:   EKG:       Recent Labs: 08/12/2023: ALT 21; TSH 1.050 08/16/2023: BUN 4; Creatinine, Ser 0.55; Hemoglobin 15.2; Platelets 274; Potassium 4.5; Sodium 130  Recent Lipid Panel    Component Value Date/Time   CHOL 379 (H) 05/23/2022 0936   TRIG 86 05/23/2022 0936   HDL 94 05/23/2022 0936   CHOLHDL 4.0 05/23/2022 0936   CHOLHDL 3.9 08/25/2015 0904   VLDL 14 08/25/2015 0904   LDLCALC 273 (H) 05/23/2022 0936    Physical Exam:    VS:  BP 120/60   Pulse 88   Ht 5' 2 (1.575 m)   Wt 124 lb (56.2 kg)   LMP 08/18/2015   SpO2 97%   BMI 22.68 kg/m     Wt Readings from Last 3 Encounters:  09/05/23 124 lb (56.2 kg)  08/20/23 120 lb (54.4 kg)  08/15/23 125 lb (56.7 kg)     GENERAL:  Well nourished, well developed in no acute distress NECK: No JVD; No carotid bruits CARDIAC: RRR, S1 and S2 present, no murmurs, no rubs, no gallops CHEST:  Clear to auscultation without rales, wheezing or rhonchi  Extremities: No pitting pedal edema. Pulses bilaterally symmetric with radial 2+ and dorsalis pedis 2+.  Right forearm cath access site with bruising extending up the forearm midway to the elbow. NEUROLOGIC:  Alert and oriented x 3  Medication Adjustments/Labs and Tests Ordered: Current medicines are reviewed at length with the patient today.  Concerns regarding medicines are outlined above.  Orders Placed This Encounter  Procedures   LONG TERM MONITOR XT (3-14 DAYS)   No orders of the defined types were placed in this  encounter.   Signed, Alean jess Kobus, MD, MPH, [facility]. 09/05/2023 12:30 PM    [facility]

## 2023-09-05 NOTE — Assessment & Plan Note (Signed)
 In the setting of recurrent episodes of paroxysmal SVT in the past and now with palpitations suggestive of similar episodes we will proceed with Zio patch for 14 days.  Will hold off on starting any beta-blockers at this time as she reports heart rates at times tend to be low. If heart rates are within normal range and she does have episodes of paroxysmal SVT will start a low-dose beta-blocker such as metoprolol  and she is agreeable with this.

## 2023-09-06 ENCOUNTER — Telehealth: Payer: Self-pay | Admitting: Licensed Clinical Social Worker

## 2023-09-07 ENCOUNTER — Other Ambulatory Visit: Payer: Self-pay | Admitting: Nurse Practitioner

## 2023-09-07 DIAGNOSIS — N959 Unspecified menopausal and perimenopausal disorder: Secondary | ICD-10-CM

## 2023-09-13 ENCOUNTER — Other Ambulatory Visit: Payer: Self-pay | Admitting: Licensed Clinical Social Worker

## 2023-09-13 DIAGNOSIS — I471 Supraventricular tachycardia, unspecified: Secondary | ICD-10-CM

## 2023-09-13 DIAGNOSIS — G8929 Other chronic pain: Secondary | ICD-10-CM

## 2023-09-13 DIAGNOSIS — K50919 Crohn's disease, unspecified, with unspecified complications: Secondary | ICD-10-CM

## 2023-09-16 NOTE — Patient Instructions (Signed)
 Visit Information  Thank you for taking time to visit with me today. Please don't hesitate to contact me if I can be of assistance to you before our next scheduled appointment.  Your next care management appointment is by telephone on 08/22 at 1:30 PM  Please call the care guide team at [phone] if you need to cancel, schedule, or reschedule an appointment.   Please call the Suicide and Crisis Lifeline: 988 go to [facility] Urgent [facility] [address] [place] [phone]) call 911 if you are experiencing a Mental Health or Behavioral Health Crisis or need someone to talk to.  Rolin Kerns, LCSW [place]  [facility], [facility] Clinical Social Worker Direct Dial: [phone]  Fax: [phone] Website: [email or web address] 3:58 PM

## 2023-09-16 NOTE — Patient Outreach (Signed)
 Complex Care Management   Visit Note  09/13/2023  Name:  Autumn Robinson MRN: [id number] DOB: Mar 09, 1965  Situation: Referral received for Complex Care Management related to Stress I obtained verbal consent from Patient.  Visit completed with pt  on the phone  Background:   Past Medical History:  Diagnosis Date   Allergy    seasonal   Chest pain 06/05/2011   CT chest 04/2011:  No infiltrate, no effusion, no PE EKG 2013:  No change to suggest pericarditis    Chest pain of uncertain etiology 06/05/2011   CT chest 04/2011:  No infiltrate, no effusion, no PE  EKG 2013:  No change to suggest pericarditis     Chronic bilateral low back pain without sciatica 05/23/2022   Crohn's disease (HCC)    followed by Dr. Rosabel Balder for this. visit from 10/12/2015 scanned into chart   High risk medication use 05/30/2022   HLD (hyperlipidemia) 12/17/2012   IUD (intrauterine device) in place 08/26/2013   paraguard 07/2013  Gyn Inocente Salt, NP     OSA (obstructive sleep apnea)    mild per sleep study.    Paroxysmal SVT (supraventricular tachycardia) (HCC) 08/10/2013   Plantar wart of left foot 05/30/2022   Routine general medical examination at a health care facility 08/25/2015   Statin declined 04/14/2018    Assessment: Patient Reported Symptoms:  Cognitive Cognitive Status: No symptoms reported, Alert and oriented to person, place, and time, Normal speech and language skills      Neurological Neurological Review of Symptoms: Not assessed    HEENT HEENT Symptoms Reported: Not assessed      Cardiovascular Cardiovascular Symptoms Reported: Not assessed    Respiratory      Endocrine Endocrine Symptoms Reported: Not assessed    Gastrointestinal Gastrointestinal Symptoms Reported: Not assessed      Genitourinary Genitourinary Symptoms Reported: Not assessed    Integumentary Integumentary Symptoms Reported: Not assessed    Musculoskeletal Musculoskelatal Symptoms Reviewed: Not assessed         Psychosocial Psychosocial Symptoms Reported: No symptoms reported Additional Psychological Details: Pt reports she is feeling good and managing symptoms well. Endorsed a decrease in anxiety symptoms Behavioral Management Strategies: Coping strategies Major Change/Loss/Stressor/Fears (CP): Medical condition, self        09/13/2023    3:35 PM  Depression screen PHQ 2/9  Decreased Interest 0  Down, Depressed, Hopeless 1  PHQ - 2 Score 1  Altered sleeping 0  Tired, decreased energy 3  Change in appetite 2  Feeling bad or failure about yourself  1  Trouble concentrating 0  Moving slowly or fidgety/restless 0  Suicidal thoughts 1  PHQ-9 Score 8    There were no vitals filed for this visit.  Medications Reviewed Today     Reviewed by Norva Bowe D, LCSW (Social Worker) on 09/16/23 at 1550  Med List Status: <None>   Medication Order Taking? Sig Documenting Provider Last Dose Status Informant  acetaminophen  (TYLENOL ) 325 MG tablet [id number]  Take 325-650 mg by mouth every 6 (six) hours as needed (pain.). [provider]  Active Self  aspirin  EC 325 MG tablet [id number]  Take 650 mg by mouth 2 (two) times daily as needed (pain.). [provider]  Active Self  aspirin  EC 81 MG tablet [id number]  Take 1 tablet (81 mg total) by mouth daily. Swallow whole. Madireddy, Alean SAUNDERS, MD  Active Self  budesonide (ENTOCORT EC) 3 MG 24 hr capsule [id number]  Take 6 mg by  mouth in the morning.  Patient taking differently: Take 6 mg by mouth as needed (crohns disease).   [provider]  Active Self           Med Note MAPLE, CARLO JAYSON Kitchens Aug 12, 2023 10:03 AM)    Cholecalciferol (VITAMIN D  PO) [id number]  Take 1 tablet by mouth every evening. [provider]  Active Self  dextromethorphan (DELSYM) 30 MG/5ML liquid [id number]  Take by mouth as needed for cough. [provider]  Active Self  estradiol  (ESTRACE ) 0.5 MG tablet [id number]  TAKE 1 TABLET  BY MOUTH EVERY DAY Early, Sara E, NP  Active   ibuprofen  (ADVIL ) 200 MG tablet [id number]  Take 400 mg by mouth every 6 (six) hours as needed (pain.). [provider]  Active Self  loperamide (IMODIUM) 2 MG capsule [id number]  Take 2 mg by mouth 4 (four) times daily as needed for diarrhea or loose stools. [provider]  Active Self  loratadine (CLARITIN) 10 MG tablet [id number]  Take 10 mg by mouth daily. [provider]  Active Self  MAGNESIUM OXIDE PO [id number]  Take 100 mg by mouth every evening. [provider]  Active Self  medroxyPROGESTERone  (PROVERA ) 2.5 MG tablet [id number]  TAKE 1 TABLET BY MOUTH EVERY DAY Early, Sara E, NP  Active   Menaquinone-7 (VITAMIN K2 PO) [id number]  Take 1 tablet by mouth daily with supper. [provider]  Active Self  Multiple Vitamin (MULTIVITAMIN WITH MINERALS) TABS tablet [id number]  Take 1 tablet by mouth daily with supper. Women's One Daily Multivitamin by [employer] [provider]  Active Self  naproxen sodium (ALEVE) 220 MG tablet [id number]  Take 220 mg by mouth 2 (two) times daily as needed (severe pain.). [provider]  Active Self  Potassium 99 MG TABS [id number]  Take 198 mg by mouth daily as needed (muscle cramping/pain.). [provider]  Active Self  vitamin C (ASCORBIC ACID) 250 MG tablet [id number]  Take 250 mg by mouth every evening. [provider]  Active Self            Recommendation:   Continue Current Plan of Care  Follow Up Plan:   Telephone follow-up in 1 month  Rolin Kerns, LCSW [facility]  [facility], [facility] Clinical Social Worker Direct Dial: [phone]  Fax: [phone] Website: [email or web address] 3:58 PM

## 2023-09-17 ENCOUNTER — Telehealth: Payer: Self-pay

## 2023-09-17 NOTE — Progress Notes (Unsigned)
 Complex Care Management Note Care Guide Note  09/17/2023 Name: Autumn Robinson MRN: [id number] DOB: 1965/03/28   Complex Care Management Outreach Attempts: An unsuccessful telephone outreach was attempted today to offer the patient information about available complex care management services.  Follow Up Plan:  Additional outreach attempts will be made to offer the patient complex care management information and services.   Encounter Outcome:  No Answer  Leotis [facility], [facility] Guide  Direct Dial: [phone]  Fax [phone]

## 2023-09-18 ENCOUNTER — Telehealth: Payer: Self-pay | Admitting: *Deleted

## 2023-09-18 NOTE — Progress Notes (Signed)
 Complex Care Management Note Care Guide Note  09/18/2023 Name: Autumn Robinson MRN: [id number] DOB: [birth year]   Complex Care Management Outreach Attempts: An unsuccessful telephone outreach was attempted today to offer the patient information about available complex care management services.  Follow Up Plan:  Additional outreach attempts will be made to offer the patient complex care management information and services.   Encounter Outcome:  No Answer [facility] Guide  Direct Dial:[phone] [phone] Website: [email or web address]

## 2023-09-18 NOTE — Progress Notes (Signed)
 Complex Care Management Note  Care Guide Note 09/18/2023 Name: Autumn Robinson MRN: [id number] DOB: 10-Dec-1965  Autumn Robinson is a 58 y.o. year old female who sees Early, Camie BRAVO, NP for primary care. I reached out to Devere JONETTA Applethwaite by phone today to offer complex care management services.  [name] was given information about Complex Care Management services today including:   The Complex Care Management services include support from the care team which includes your Nurse Care Manager, Clinical Social Worker, or Pharmacist.  The Complex Care Management team is here to help remove barriers to the health concerns and goals most important to you. Complex Care Management services are voluntary, and the patient may decline or stop services at any time by request to their care team member.   Complex Care Management Consent Status: Patient agreed to services and verbal consent obtained.   Follow up plan:  Telephone appointment with complex care management team member scheduled for:  10/22/23 @ 1 PM   Encounter Outcome:  Patient Scheduled   Leotis [facility], [facility] Guide  Direct Dial: [phone]  Fax [phone]

## 2023-09-19 ENCOUNTER — Telehealth: Payer: Self-pay | Admitting: *Deleted

## 2023-09-19 NOTE — Progress Notes (Signed)
 Complex Care Management Note Care Guide Note  09/19/2023 Name: Autumn Robinson MRN: [id number] DOB: February 03, 1966   Complex Care Management Outreach Attempts: An unsuccessful telephone outreach was attempted today to offer the patient information about available complex care management services.  Follow Up Plan:  Additional outreach attempts will be made to offer the patient complex care management information and services.   Encounter Outcome:  Patient Request to Call Back Wants me to call back Monday after 1 pm  Kalvyn Desa Greenauer-Moran  [facility] Guide  Direct Dial:[phone] Fax:[phone] Website: [email or web address]

## 2023-09-20 ENCOUNTER — Encounter: Admitting: Nurse Practitioner

## 2023-09-24 ENCOUNTER — Telehealth: Payer: Self-pay | Admitting: *Deleted

## 2023-09-24 NOTE — Progress Notes (Signed)
 Complex Care Management Note Care Guide Note  09/24/2023 Name: Autumn Robinson MRN: [id number] DOB: 1965/05/29   Complex Care Management Outreach Attempts: A third unsuccessful outreach was attempted today to offer the patient with information about available complex care management services.  Follow Up Plan:  No further outreach attempts will be made at this time. We have been unable to contact the patient to offer or enroll patient in complex care management services.  Encounter Outcome:  No Answer  [facility] Guide  Direct Dial:[phone] [phone] Website: [email or web address]

## 2023-09-26 ENCOUNTER — Other Ambulatory Visit

## 2023-09-26 NOTE — Progress Notes (Signed)
   09/26/2023  Patient ID: Autumn Robinson, female   DOB: August 13, 1965, 58 y.o.   MRN: [id number]  Contacted patient via telephone to follow up on status of Remicade and latest visit with GI.  Reviewed gastro note from 09/20/23. Patient still not taking budesonide and plan to takes as needed for chron's symptoms.   Remicade appeal review scheduled for 10/02/23. Patient plans to call today to confirm what is to be expected of her at this meeting and if provider needs to be present.  Follow Up: 10/03/23   Autumn Robinson, PharmD Clinical Pharmacist [phone]

## 2023-10-03 ENCOUNTER — Other Ambulatory Visit

## 2023-10-03 ENCOUNTER — Telehealth: Payer: Self-pay

## 2023-10-03 NOTE — Progress Notes (Addendum)
   10/03/2023  Patient ID: Autumn Robinson, female   DOB: Jun 23, 1965, 58 y.o.   MRN: [id number]  Attempted to contact patient for scheduled appointment for medication management. Left HIPAA compliant message for patient to return my call at their convenience.  Patient returned call, see appt encounter for details.  Jon VEAR [place], PharmD Clinical Pharmacist [phone]

## 2023-10-03 NOTE — Progress Notes (Signed)
   10/03/2023  Patient ID: Autumn Robinson, female   DOB: 03/24/1965, 58 y.o.   MRN: [id number]  Contacted patient via telephone to follow up on status of Remicade.  Remicade appeal review occurred on 10/02/23. Patient expects to receive a letter by end of this week with decision and will discuss next steps with gastro.  Patients to reach back out with any medication needs or questions as they arise, no follow up scheduled at this time. Asked patient to just make sure we are aware if any medication changes are made.   Jon VEAR Lindau, PharmD Clinical Pharmacist [phone]

## 2023-10-11 ENCOUNTER — Other Ambulatory Visit: Payer: Self-pay | Admitting: Licensed Clinical Social Worker

## 2023-10-18 NOTE — Patient Outreach (Signed)
 Complex Care Management   Visit Note  10/11/2023  Name:  Autumn Robinson MRN: [id number] DOB: 1965-06-15  Situation: Referral received for Complex Care Management related to Stress I obtained verbal consent from Patient.  Visit completed with Patient  on the phone  Background:   Past Medical History:  Diagnosis Date   Allergy    seasonal   Chest pain 06/05/2011   CT chest 04/2011:  No infiltrate, no effusion, no PE EKG 2013:  No change to suggest pericarditis    Chest pain of uncertain etiology 06/05/2011   CT chest 04/2011:  No infiltrate, no effusion, no PE  EKG 2013:  No change to suggest pericarditis     Chronic bilateral low back pain without sciatica 05/23/2022   Crohn's disease (HCC)    followed by Dr. Rosabel Balder for this. visit from 10/12/2015 scanned into chart   High risk medication use 05/30/2022   HLD (hyperlipidemia) 12/17/2012   IUD (intrauterine device) in place 08/26/2013   paraguard 07/2013  Gyn Inocente Salt, NP     OSA (obstructive sleep apnea)    mild per sleep study.    Paroxysmal SVT (supraventricular tachycardia) (HCC) 08/10/2013   Plantar wart of left foot 05/30/2022   Routine general medical examination at a health care facility 08/25/2015   Statin declined 04/14/2018    Assessment: Patient Reported Symptoms:  Cognitive Cognitive Status: No symptoms reported, Alert and oriented to person, place, and time, Normal speech and language skills Cognitive/Intellectual Conditions Management [RPT]: None reported or documented in medical history or problem list   Health Maintenance Behaviors: Annual physical exam  Neurological Neurological Review of Symptoms: Not assessed    HEENT HEENT Symptoms Reported: No symptoms reported      Cardiovascular Cardiovascular Symptoms Reported: Not assessed    Respiratory Respiratory Symptoms Reported: Not assesed    Endocrine Endocrine Symptoms Reported: No symptoms reported    Gastrointestinal Additional  Gastrointestinal Details: Pt appealed recent denial of preferred medication to assist with management of symptoms. Continues to work with pharmacy for strengthened support      Genitourinary Genitourinary Symptoms Reported: No symptoms reported    Integumentary Integumentary Symptoms Reported: Not assessed    Musculoskeletal Musculoskelatal Symptoms Reviewed: Not assessed        Psychosocial Psychosocial Symptoms Reported: No symptoms reported Additional Psychological Details: Pt reports she doesn't feel depressed today Endorses decrease in anxiety symptoms after recieving recent test results regarding health. Pt reports she is thinking clearly and retaining info/focus Behavioral Management Strategies: Coping strategies Major Change/Loss/Stressor/Fears (CP): Medical condition, self Techniques to Cope with Loss/Stress/Change: Diversional activities, Spiritual practice(s)      10/18/2023    PHQ2-9 Depression Screening   Little interest or pleasure in doing things    Feeling down, depressed, or hopeless    PHQ-2 - Total Score    Trouble falling or staying asleep, or sleeping too much    Feeling tired or having little energy    Poor appetite or overeating     Feeling bad about yourself - or that you are a failure or have let yourself or your family down    Trouble concentrating on things, such as reading the newspaper or watching television    Moving or speaking so slowly that other people could have noticed.  Or the opposite - being so fidgety or restless that you have been moving around a lot more than usual    Thoughts that you would be better off dead, or hurting yourself  in some way    PHQ2-9 Total Score    If you checked off any problems, how difficult have these problems made it for you to do your work, take care of things at home, or get along with other people    Depression Interventions/Treatment      There were no vitals filed for this visit.  Medications Reviewed  Today     Reviewed by Ezzard Rolin BIRCH, LCSW (Social Worker) on 10/18/23 at 0510  Med List Status: <None>   Medication Order Taking? Sig Documenting Provider Last Dose Status Informant  acetaminophen  (TYLENOL ) 325 MG tablet [id number]  Take 325-650 mg by mouth every 6 (six) hours as needed (pain.). [provider]  Active Self  aspirin  EC 325 MG tablet [id number]  Take 650 mg by mouth 2 (two) times daily as needed (pain.). [provider]  Active Self  aspirin  EC 81 MG tablet [id number]  Take 1 tablet (81 mg total) by mouth daily. Swallow whole. Madireddy, Alean SAUNDERS, MD  Active Self  budesonide (ENTOCORT EC) 3 MG 24 hr capsule [id number]  Take 6 mg by mouth in the morning.  Patient taking differently: Take 6 mg by mouth as needed (crohns disease).   [provider]  Active Self           Med Note MAPLE, CARLO JAYSON Kitchens Aug 12, 2023 10:03 AM)    Cholecalciferol (VITAMIN D  PO) [id number]  Take 1 tablet by mouth every evening. [provider]  Active Self  dextromethorphan (DELSYM) 30 MG/5ML liquid [id number]  Take by mouth as needed for cough. [provider]  Active Self  estradiol  (ESTRACE ) 0.5 MG tablet [id number]  TAKE 1 TABLET BY MOUTH EVERY DAY Early, Sara E, NP  Active   ibuprofen  (ADVIL ) 200 MG tablet [id number]  Take 400 mg by mouth every 6 (six) hours as needed (pain.). [provider]  Active Self  loperamide (IMODIUM) 2 MG capsule [id number]  Take 2 mg by mouth 4 (four) times daily as needed for diarrhea or loose stools. [provider]  Active Self  loratadine (CLARITIN) 10 MG tablet [id number]  Take 10 mg by mouth daily. [provider]  Active Self  MAGNESIUM OXIDE PO [id number]  Take 100 mg by mouth every evening. [provider]  Active Self  medroxyPROGESTERone  (PROVERA ) 2.5 MG tablet [id number]  TAKE 1 TABLET BY MOUTH EVERY DAY Early, Sara E, NP  Active   Menaquinone-7 (VITAMIN K2 PO) [id number]  Take 1 tablet by  mouth daily with supper. [provider]  Active Self  Multiple Vitamin (MULTIVITAMIN WITH MINERALS) TABS tablet [id number]  Take 1 tablet by mouth daily with supper. Women's One Daily Multivitamin by [employer] [provider]  Active Self  naproxen sodium (ALEVE) 220 MG tablet [id number]  Take 220 mg by mouth 2 (two) times daily as needed (severe pain.). [provider]  Active Self  Potassium 99 MG TABS [id number]  Take 198 mg by mouth daily as needed (muscle cramping/pain.). [provider]  Active Self  vitamin C (ASCORBIC ACID) 250 MG tablet [id number]  Take 250 mg by mouth every evening. [provider]  Active Self            Recommendation:   Continue Current Plan of Care  Follow Up Plan:   Telephone follow-up in 1 month  Rolin Ezzard HUGHS [facility]  [facility], [facility] Clinical Social Worker Direct Dial: [phone]  Fax: [phone]  Website: [email or web address] 5:20 AM

## 2023-10-18 NOTE — Patient Instructions (Signed)
 Visit Information  Thank you for taking time to visit with me today. Please don't hesitate to contact me if I can be of assistance to you before our next scheduled appointment.  Your next care management appointment is by telephone on 09/19 at 12:30 PM   Please call the care guide team at [phone] if you need to cancel, schedule, or reschedule an appointment.   Please call the Suicide and Crisis Lifeline: 988 go to [facility] [address] [place] [phone]) call 911 if you are experiencing a Mental Health or Behavioral Health Crisis or need someone to talk to.  Rolin Kerns, LCSW [place]  [facility], [facility] Clinical Social Worker Direct Dial: [phone]  Fax: [phone] Website: [email or web address] 5:20 AM

## 2023-10-22 ENCOUNTER — Other Ambulatory Visit: Payer: Self-pay

## 2023-10-25 NOTE — Patient Outreach (Addendum)
 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  I obtained verbal consent from Patient.  Visit completed with Patient on the phone.  Background:   Past Medical History:  Diagnosis Date   Allergy    seasonal   Chest pain 06/05/2011   CT chest 04/2011:  No infiltrate, no effusion, no PE EKG 2013:  No change to suggest pericarditis    Chest pain of uncertain etiology 06/05/2011   CT chest 04/2011:  No infiltrate, no effusion, no PE  EKG 2013:  No change to suggest pericarditis     Chronic bilateral low back pain without sciatica 05/23/2022   Crohn's disease (HCC)    followed by Dr. Rosabel Balder for this. visit from 10/12/2015 scanned into chart   High risk medication use 05/30/2022   HLD (hyperlipidemia) 12/17/2012   IUD (intrauterine device) in place 08/26/2013   paraguard 07/2013  Gyn Inocente Salt, NP     OSA (obstructive sleep apnea)    mild per sleep study.    Paroxysmal SVT (supraventricular tachycardia) (HCC) 08/10/2013   Plantar wart of left foot 05/30/2022   Routine general medical examination at a health care facility 08/25/2015   Statin declined 04/14/2018    Assessment: Patient Reported Symptoms:  Cognitive Cognitive Status: Alert and oriented to person, place, and time, Normal speech and language skills, Struggling with memory recall Cognitive/Intellectual Conditions Management [RPT]: None reported or documented in medical history or problem list   Health Maintenance Behaviors: Annual physical exam, Healthy diet, Hobbies, Social activities Healing Pattern: Slow Health Facilitated by: Healthy diet, Stress management  Neurological  Neurological Symptoms Reported: No symptoms reported     HEENT HEENT Symptoms  Reported: No symptoms reported      Cardiovascular Cardiovascular Symptoms Reported: No symptoms reported Does patient have uncontrolled Hypertension?: No Cardiovascular Self-Management Outcome: 4 (good)  Respiratory Respiratory Symptoms Reported: No symptoms reported    Endocrine Endocrine Symptoms Reported: No symptoms reported    Gastrointestinal Gastrointestinal Symptoms Reported: No symptoms reported      Genitourinary Genitourinary Symptoms Reported: No symptoms reported    Integumentary Integumentary Symptoms Reported: Skin changes Additional Integumentary Details: potential wars Skin Management Strategies: Routine screening Skin Self-Management Outcome: 3 (uncertain)  Musculoskeletal Musculoskelatal Symptoms Reviewed: No symptoms reported   Falls in the past year?: Yes Number of falls in past year: 1 or less Was there an injury with Fall?: Yes Fall Risk Category Calculator: 2 Patient Fall Risk Level: Moderate Fall Risk Fall risk Follow up: Falls evaluation completed  Psychosocial Psychosocial Symptoms Reported: No symptoms reported   Major Change/Loss/Stressor/Fears (CP): Medical condition, self Techniques to Cope with Loss/Stress/Change: Diversional activities Quality of Family Relationships: involved, supportive, helpful Do you feel physically threatened by others?: No    10/25/2023    PHQ2-9 Depression Screening   Kaulin Chaves interest or pleasure in doing things Not at all  Feeling down, depressed, or hopeless Not at all  PHQ-2 - Total Score 0  Trouble falling or staying asleep, or sleeping too much    Feeling tired or having Lahela Woodin energy    Poor appetite or overeating     Feeling bad about yourself - or that you are a failure or have let yourself or your family down    Trouble concentrating on things, such as  reading the newspaper or watching television    Moving or speaking so slowly that other people could have noticed.  Or the opposite - being so fidgety or  restless that you have been moving around a lot more than usual    Thoughts that you would be better off dead, or hurting yourself in some way    PHQ2-9 Total Score    If you checked off any problems, how difficult have these problems made it for you to do your work, take care of things at home, or get along with other people    Depression Interventions/Treatment      There were no vitals filed for this visit.  Medications Reviewed Today     Reviewed by Morgan Clayborne CROME, RN (Registered Nurse) on 10/22/23 at 1353  Med List Status: <None>   Medication Order Taking? Sig Documenting Provider Last Dose Status Informant  acetaminophen  (TYLENOL ) 325 MG tablet [id number]  Take 325-650 mg by mouth every 6 (six) hours as needed (pain.). [provider]  Consider Medication Status and Discontinue (Completed Course) Self  aspirin  EC 325 MG tablet [id number]  Take 650 mg by mouth 2 (two) times daily as needed (pain.). [provider]  Consider Medication Status and Discontinue (Discontinued by provider) Self  aspirin  EC 81 MG tablet [id number]  Take 1 tablet (81 mg total) by mouth daily. Swallow whole. Madireddy, Alean SAUNDERS, MD  Consider Medication Status and Discontinue (Discontinued by provider) Self  budesonide (ENTOCORT EC) 3 MG 24 hr capsule [id number] Yes Take 6 mg by mouth in the morning.  Patient taking differently: Take 6 mg by mouth as needed (crohns disease). Patient takes a titrated 9 day course during a flare as directed   [provider]  Active Self           Med Note MAPLE, RILEY C   Mon Aug 12, 2023 10:03 AM)    Cholecalciferol (VITAMIN D  PO) [id number] Yes Take 1 tablet by mouth every evening. [provider]  Active Self  dextromethorphan (DELSYM) 30 MG/5ML liquid [id number]  Take by mouth as needed for cough.  Patient not taking: Reported on 10/22/2023   [provider]  Active Self  estradiol  (ESTRACE ) 0.5 MG tablet [id number] Yes TAKE 1 TABLET BY  MOUTH EVERY DAY Early, Sara E, NP  Active   ibuprofen  (ADVIL ) 200 MG tablet [id number] Yes Take 400 mg by mouth every 6 (six) hours as needed (pain.). [provider]  Active Self  loperamide (IMODIUM) 2 MG capsule [id number] Yes Take 2 mg by mouth 4 (four) times daily as needed for diarrhea or loose stools. [provider]  Active Self  loratadine (CLARITIN) 10 MG tablet [id number] Yes Take 10 mg by mouth daily.  Patient taking differently: Take 10 mg by mouth daily as needed for allergies.   [provider]  Active Self  MAGNESIUM OXIDE PO [id number] Yes Take 100 mg by mouth every evening.  Patient taking differently: Take 100 mg by mouth at bedtime as needed (to help with sleep).   [provider]  Active Self  medroxyPROGESTERone  (PROVERA ) 2.5 MG tablet [id number] Yes TAKE 1 TABLET BY MOUTH EVERY DAY Early, Sara E, NP  Active   Menaquinone-7 (VITAMIN K2 PO) [id number] Yes Take 1 tablet by mouth daily with supper. [provider]  Active Self  Multiple Vitamin (MULTIVITAMIN WITH MINERALS) TABS tablet [id number] Yes Take 1 tablet by mouth daily with supper. Women's One Daily Multivitamin by [employer],  Historical, MD  Active Self  naproxen sodium (ALEVE) 220 MG tablet [id number] Yes Take 220 mg by mouth 2 (two) times daily as needed (severe pain.). [provider]  Active Self  Potassium 99 MG TABS [id number] Yes Take 198 mg by mouth daily as needed (muscle cramping/pain.). [provider]  Active Self  vitamin C (ASCORBIC ACID) 250 MG tablet [id number] Yes Take 250 mg by mouth every evening. [provider]  Active Self            Recommendation:   Specialty provider follow-up regarding your Remicade infusions as directed   Follow Up Plan:   Telephone follow up appointment date/time:  Tuesday, September 30 at 11:15 AM  Clayborne Ly RN BSN CCM [place]  [facility], [facility] Health Nurse Care  Coordinator  Direct Dial: [phone] Website: [email or web address]

## 2023-10-25 NOTE — Patient Instructions (Signed)
 Visit Information  Thank you for taking time to visit with me on 10/22/23. Please don't hesitate to contact me if I can be of assistance to you before our next scheduled appointment.  Our next appointment is by telephone on 11/19/23 at 11:15 AM Please call the care guide team at [phone] if you need to cancel or reschedule your appointment.   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   Goal: Over the next 90 days the Patient will continue to work with Medical illustrator and/or Social Worker to address care management and care coordination needs related to Crohn's disease as evidenced by adherence to care management team scheduled appointments      Interventions:   Evaluation of current treatment plan related to Crohn's disease, self-management and patient's adherence to plan as established by provider Review of patient status, including review of consultant's reports, relevant laboratory and other test results, and medication reconciliation completed Determined patient's health plan approved her Remicade, she is awaiting on a call from the infusion clinic to schedule her initial infusion  Discussed currently, patient's recent Crohn's flare has resolved Nutrition Assessment completed  Assessed for SDOH barriers, none identified at this time, educated patient about http://[email or web address]-peterson.info/ option to help locate community resources if needed, patient verbalizes understanding  Discussed plans with patient for ongoing care management follow up and provided patient with direct contact information for care management team   Patient Self-Care Activities:  Attend all scheduled provider appointments Call pharmacy for medication refills 3-7 days in  advance of running out of medications Call provider office for new concerns or questions  Take medications as prescribed   Work with the nurse care manager to address care coordination needs and will continue to work with the clinical team to address health care and disease management related needs  Plan:  Follow up with provider re: Remicade infusion as directed  Telephone follow up appointment with care management team member scheduled for:  Tuesday, September 30 at 11:15 AM             Please call 1-800-273-TALK (toll free, 24 hour hotline) if you are experiencing a Mental Health or Behavioral Health Crisis or need someone to talk to.  Patient verbalizes understanding of instructions and care plan provided today and agrees to view in MyChart. Active MyChart status and patient understanding of how to access instructions and care plan via MyChart confirmed with patient.     Clayborne Ly RN BSN CCM [place]  [facility], [facility] Nurse Care Coordinator  Direct Dial: [phone] Website: [email or web address]

## 2023-11-08 ENCOUNTER — Other Ambulatory Visit: Payer: Self-pay | Admitting: Licensed Clinical Social Worker

## 2023-11-11 ENCOUNTER — Telehealth: Payer: Self-pay

## 2023-11-11 ENCOUNTER — Ambulatory Visit: Payer: Self-pay

## 2023-11-11 DIAGNOSIS — I471 Supraventricular tachycardia, unspecified: Secondary | ICD-10-CM | POA: Diagnosis not present

## 2023-11-11 NOTE — Telephone Encounter (Signed)
 Patient is requesting a call back to review monitor results. She would also like to have this information released to MyChart if possible.

## 2023-11-11 NOTE — Telephone Encounter (Signed)
 Note sent to pt by Dr. Liborio

## 2023-11-12 MED ORDER — METOPROLOL TARTRATE 25 MG PO TABS: 25.0000 mg | ORAL_TABLET | Freq: Two times a day (BID) | ORAL | 3 refills | Status: AC

## 2023-11-12 NOTE — Telephone Encounter (Signed)
 Prescription sent per Dr. Madireddy's recommendations of Metoprolol  Tartrate 25 mg by mouth 2 times daily to [facility] on [address], [place].

## 2023-11-15 NOTE — Patient Outreach (Signed)
 Complex Care Management   Visit Note  11/08/2023  Name:  Autumn Robinson MRN: [id number] DOB: March 31, 1965  Situation: Referral received for Complex Care Management related to Stress I obtained verbal consent from Patient.  Visit completed with Patient  on the phone  Background:   Past Medical History:  Diagnosis Date   Allergy    seasonal   Chest pain 06/05/2011   CT chest 04/2011:  No infiltrate, no effusion, no PE EKG 2013:  No change to suggest pericarditis    Chest pain of uncertain etiology 06/05/2011   CT chest 04/2011:  No infiltrate, no effusion, no PE  EKG 2013:  No change to suggest pericarditis     Chronic bilateral low back pain without sciatica 05/23/2022   Crohn's disease (HCC)    followed by Dr. Rosabel Balder for this. visit from 10/12/2015 scanned into chart   High risk medication use 05/30/2022   HLD (hyperlipidemia) 12/17/2012   IUD (intrauterine device) in place 08/26/2013   paraguard 07/2013  Gyn Inocente Salt, NP     OSA (obstructive sleep apnea)    mild per sleep study.    Paroxysmal SVT (supraventricular tachycardia) 08/10/2013   Plantar wart of left foot 05/30/2022   Routine general medical examination at a health care facility 08/25/2015   Statin declined 04/14/2018    Assessment: Patient Reported Symptoms:  Cognitive Cognitive Status: No symptoms reported, Alert and oriented to person, place, and time, Normal speech and language skills Cognitive/Intellectual Conditions Management [RPT]: None reported or documented in medical history or problem list      Neurological Neurological Review of Symptoms: Not assessed    HEENT HEENT Symptoms Reported: Not assessed      Cardiovascular Cardiovascular Symptoms Reported: Not assessed    Respiratory Respiratory Symptoms Reported: Not assesed    Endocrine Endocrine Symptoms Reported: Not assessed Is patient diabetic?: No    Gastrointestinal Gastrointestinal Symptoms Reported: No symptoms reported Additional  Gastrointestinal Details: Preferred medication was approved      Genitourinary Genitourinary Symptoms Reported: Not assessed    Integumentary Integumentary Symptoms Reported: Not assessed    Musculoskeletal Musculoskelatal Symptoms Reviewed: Not assessed        Psychosocial Psychosocial Symptoms Reported: No symptoms reported Behavioral Management Strategies: Coping strategies Major Change/Loss/Stressor/Fears (CP): Denies      11/15/2023    PHQ2-9 Depression Screening   Little interest or pleasure in doing things    Feeling down, depressed, or hopeless    PHQ-2 - Total Score    Trouble falling or staying asleep, or sleeping too much    Feeling tired or having little energy    Poor appetite or overeating     Feeling bad about yourself - or that you are a failure or have let yourself or your family down    Trouble concentrating on things, such as reading the newspaper or watching television    Moving or speaking so slowly that other people could have noticed.  Or the opposite - being so fidgety or restless that you have been moving around a lot more than usual    Thoughts that you would be better off dead, or hurting yourself in some way    PHQ2-9 Total Score    If you checked off any problems, how difficult have these problems made it for you to do your work, take care of things at home, or get along with other people    Depression Interventions/Treatment      There were no vitals  filed for this visit.  Medications Reviewed Today     Reviewed by Ezzard Rolin BIRCH, LCSW (Social Worker) on 11/15/23 at 1205  Med List Status: <None>   Medication Order Taking? Sig Documenting Provider Last Dose Status Informant  acetaminophen  (TYLENOL ) 325 MG tablet [id number]  Take 325-650 mg by mouth every 6 (six) hours as needed (pain.). [provider]  Active Self  aspirin  EC 325 MG tablet [id number]  Take 650 mg by mouth 2 (two) times daily as needed (pain.). [provider]   Active Self  aspirin  EC 81 MG tablet [id number]  Take 1 tablet (81 mg total) by mouth daily. Swallow whole. Madireddy, Alean SAUNDERS, MD  Active Self  budesonide (ENTOCORT EC) 3 MG 24 hr capsule [id number]  Take 6 mg by mouth in the morning.  Patient taking differently: Take 6 mg by mouth as needed (crohns disease). Patient takes a titrated 9 day course during a flare as directed   [provider]  Active Self           Med Note MAPLE, RILEY C   Mon Aug 12, 2023 10:03 AM)    Cholecalciferol (VITAMIN D  PO) [id number]  Take 1 tablet by mouth every evening. [provider]  Active Self  dextromethorphan (DELSYM) 30 MG/5ML liquid [id number]  Take by mouth as needed for cough.  Patient not taking: Reported on 10/22/2023   [provider]  Active Self  estradiol  (ESTRACE ) 0.5 MG tablet [id number]  TAKE 1 TABLET BY MOUTH EVERY DAY Early, Sara E, NP  Active   ibuprofen  (ADVIL ) 200 MG tablet [id number]  Take 400 mg by mouth every 6 (six) hours as needed (pain.). [provider]  Active Self  loperamide (IMODIUM) 2 MG capsule [id number]  Take 2 mg by mouth 4 (four) times daily as needed for diarrhea or loose stools. [provider]  Active Self  loratadine (CLARITIN) 10 MG tablet [id number]  Take 10 mg by mouth daily.  Patient taking differently: Take 10 mg by mouth daily as needed for allergies.   [provider]  Active Self  MAGNESIUM OXIDE PO [id number]  Take 100 mg by mouth every evening.  Patient taking differently: Take 100 mg by mouth at bedtime as needed (to help with sleep).   [provider]  Active Self  medroxyPROGESTERone  (PROVERA ) 2.5 MG tablet [id number]  TAKE 1 TABLET BY MOUTH EVERY DAY Early, Sara E, NP  Active   Menaquinone-7 (VITAMIN K2 PO) [id number]  Take 1 tablet by mouth daily with supper. [provider]  Active Self  metoprolol  tartrate (LOPRESSOR ) 25 MG tablet [id number]  Take 1 tablet (25 mg total) by mouth 2 (two) times  daily. Madireddy, Alean SAUNDERS, MD  Active   Multiple Vitamin (MULTIVITAMIN WITH MINERALS) TABS tablet [id number]  Take 1 tablet by mouth daily with supper. Women's One Daily Multivitamin by [employer] [provider]  Active Self  naproxen sodium (ALEVE) 220 MG tablet [id number]  Take 220 mg by mouth 2 (two) times daily as needed (severe pain.). [provider]  Active Self  Potassium 99 MG TABS [id number]  Take 198 mg by mouth daily as needed (muscle cramping/pain.). [provider]  Active Self  vitamin C (ASCORBIC ACID) 250 MG tablet [id number]  Take 250 mg by mouth every evening. [provider]  Active Self            Recommendation:   Continue Current Plan of Care  Follow Up Plan:  Patient has met all care management goals. Care Management case will be closed. Patient has been provided contact information should new needs arise.   Rolin Ezzard HUGHS [facility] Health  [facility], [facility] Clinical Social Worker Direct Dial: [phone]  Fax: [phone] Website: [email or web address] 12:11 PM

## 2023-11-15 NOTE — Patient Instructions (Signed)
 Visit Information  Thank you for taking time to visit with me today. Please don't hesitate to contact me if I can be of assistance to you before our next scheduled appointment.   Patient has met all care management goals with LCSW. LCSW care management goal will be closed. Patient has been provided contact information should new needs arise.   Please call the care guide team at [phone] if you need to cancel, schedule, or reschedule an appointment.   Please call the Suicide and Crisis Lifeline: 988 go to [facility] Urgent [facility] [address] [place] [phone]) call 911 if you are experiencing a Mental Health or Behavioral Health Crisis or need someone to talk to.  Rolin Kerns, LCSW [place]  [facility], [facility] Clinical Social Worker Direct Dial: [phone]  Fax: [phone] Website: [email or web address] 12:12 PM

## 2023-11-19 ENCOUNTER — Other Ambulatory Visit: Payer: Self-pay

## 2023-11-19 NOTE — Patient Instructions (Signed)
 Visit Information  Thank you for taking time to visit with me today. Please don't hesitate to contact me if I can be of assistance to you before our next scheduled appointment.  Your next care management appointment is by telephone on Tuesday, October 28 at 11:30 AM  Please call the care guide team at [phone] if you need to cancel, schedule, or reschedule an appointment.   Please call 1-800-273-TALK (toll free, 24 hour hotline) if you are experiencing a Mental Health or Behavioral Health Crisis or need someone to talk to.  Clayborne Ly RN BSN CCM [place]  [facility], [facility] Health Nurse Care Coordinator  Direct Dial: [phone] Website: [email or web address]

## 2023-11-19 NOTE — Patient Outreach (Signed)
 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  I obtained verbal consent from Patient.  Visit completed with Patient on the phone.  Background:   Past Medical History:  Diagnosis Date   Allergy    seasonal   Chest pain 06/05/2011   CT chest 04/2011:  No infiltrate, no effusion, no PE EKG 2013:  No change to suggest pericarditis    Chest pain of uncertain etiology 06/05/2011   CT chest 04/2011:  No infiltrate, no effusion, no PE  EKG 2013:  No change to suggest pericarditis     Chronic bilateral low back pain without sciatica 05/23/2022   Crohn's disease (HCC)    followed by Dr. Rosabel Balder for this. visit from 10/12/2015 scanned into chart   High risk medication use 05/30/2022   HLD (hyperlipidemia) 12/17/2012   IUD (intrauterine device) in place 08/26/2013   paraguard 07/2013  Gyn Inocente Salt, NP     OSA (obstructive sleep apnea)    mild per sleep study.    Paroxysmal SVT (supraventricular tachycardia) 08/10/2013   Plantar wart of left foot 05/30/2022   Routine general medical examination at a health care facility 08/25/2015   Statin declined 04/14/2018    Assessment: Patient Reported Symptoms:  Cognitive Cognitive Status: Alert and oriented to person, place, and time, Normal speech and language skills Cognitive/Intellectual Conditions Management [RPT]: None reported or documented in medical history or problem list   Health Maintenance Behaviors: Annual physical exam, Healthy diet Health Facilitated by: Healthy diet, Rest  Neurological Neurological Review of Symptoms: No symptoms reported    HEENT HEENT Symptoms Reported: No symptoms reported      Cardiovascular Cardiovascular Symptoms Reported: Irregular  pulse Does patient have uncontrolled Hypertension?: No Cardiovascular Management Strategies: Medication therapy, Routine screening, Adequate rest Cardiovascular Self-Management Outcome: 3 (uncertain) Cardiovascular Comment: patient has history of PSVT  Respiratory Respiratory Symptoms Reported: No symptoms reported    Endocrine Endocrine Symptoms Reported: No symptoms reported Is patient diabetic?: No    Gastrointestinal Gastrointestinal Symptoms Reported: Change in appetite, Other Other Gastrointestinal Symptoms: colon fistula with abcess secondary to having Crohn's disease Gastrointestinal Management Strategies: Adequate rest, Diet modification, Medication therapy Gastrointestinal Self-Management Outcome: 4 (good) Gastrointestinal Comment: patient restarted Remicade infusions, starting at 0,2,4 weeks with subsequent infusions every 8 weeks    Genitourinary Genitourinary Symptoms Reported: No symptoms reported    Integumentary Integumentary Symptoms Reported: No symptoms reported    Musculoskeletal Musculoskelatal Symptoms Reviewed: No symptoms reported   Falls in the past year?: No Number of falls in past year: 1 or less Was there an injury with Fall?: No Fall Risk Category Calculator: 0 Patient Fall Risk Level: Low Fall Risk Patient at Risk for Falls Due to: No Fall Risks  Psychosocial Psychosocial Symptoms Reported: No symptoms reported   Major Change/Loss/Stressor/Fears (CP): Denies      11/19/2023    PHQ2-9 Depression Screening   Deeanne Deininger interest or pleasure in doing things    Feeling down, depressed, or hopeless    PHQ-2 - Total Score    Trouble falling or staying asleep, or sleeping too much    Feeling tired or having [place] energy    Poor appetite or overeating     Feeling bad about yourself - or that you are a failure or have  let yourself or your family down    Trouble concentrating on things, such as reading the newspaper or watching television    Moving or  speaking so slowly that other people could have noticed.  Or the opposite - being so fidgety or restless that you have been moving around a lot more than usual    Thoughts that you would be better off dead, or hurting yourself in some way    PHQ2-9 Total Score    If you checked off any problems, how difficult have these problems made it for you to do your work, take care of things at home, or get along with other people    Depression Interventions/Treatment      There were no vitals filed for this visit.  Medications Reviewed Today     Reviewed by Morgan Clayborne CROME, RN (Registered Nurse) on 11/19/23 at 1139  Med List Status: <None>   Medication Order Taking? Sig Documenting Provider Last Dose Status Informant  acetaminophen  (TYLENOL ) 325 MG tablet [id number]  Take 325-650 mg by mouth every 6 (six) hours as needed (pain.). [provider]  Active Self  aspirin  EC 325 MG tablet [id number]  Take 650 mg by mouth 2 (two) times daily as needed (pain.). [provider]  Active Self  aspirin  EC 81 MG tablet [id number]  Take 1 tablet (81 mg total) by mouth daily. Swallow whole. Madireddy, Alean SAUNDERS, MD  Active Self  budesonide (ENTOCORT EC) 3 MG 24 hr capsule [id number]  Take 6 mg by mouth in the morning.  Patient taking differently: Take 6 mg by mouth as needed (crohns disease). Patient takes a titrated 9 day course during a flare as directed   [provider]  Active Self           Med Note MAPLE, RILEY C   Mon Aug 12, 2023 10:03 AM)    Cholecalciferol (VITAMIN D  PO) [id number]  Take 1 tablet by mouth every evening. [provider]  Active Self  dextromethorphan (DELSYM) 30 MG/5ML liquid [id number]  Take by mouth as needed for cough.  Patient not taking: Reported on 10/22/2023   [provider]  Active Self  estradiol  (ESTRACE ) 0.5 MG tablet [id number]  TAKE 1 TABLET BY MOUTH EVERY DAY Early, Sara E, NP  Active   ibuprofen  (ADVIL ) 200 MG tablet [id number]  Take  400 mg by mouth every 6 (six) hours as needed (pain.). [provider]  Active Self  loperamide (IMODIUM) 2 MG capsule [id number]  Take 2 mg by mouth 4 (four) times daily as needed for diarrhea or loose stools. [provider]  Active Self  loratadine (CLARITIN) 10 MG tablet [id number]  Take 10 mg by mouth daily.  Patient taking differently: Take 10 mg by mouth daily as needed for allergies.   [provider]  Active Self  MAGNESIUM OXIDE PO [id number]  Take 100 mg by mouth every evening.  Patient taking differently: Take 100 mg by mouth at bedtime as needed (to help with sleep).   [provider]  Active Self  medroxyPROGESTERone  (PROVERA ) 2.5 MG tablet [id number]  TAKE 1 TABLET BY MOUTH EVERY DAY Early, Sara E, NP  Active   Menaquinone-7 (VITAMIN K2 PO) [id number]  Take 1 tablet by mouth daily with supper. [provider]  Active Self  metoprolol  tartrate (LOPRESSOR ) 25 MG tablet [id number] Yes Take 1 tablet (25 mg total) by mouth 2 (two) times daily.  Patient taking differently: Take 25 mg by mouth  2 (two) times daily. Patient is taking this medication when symptoms occur.   Madireddy, Alean SAUNDERS, MD  Active   Multiple Vitamin (MULTIVITAMIN WITH MINERALS) TABS tablet [id number]  Take 1 tablet by mouth daily with supper. Women's One Daily Multivitamin by [employer] [provider]  Active Self  naproxen sodium (ALEVE) 220 MG tablet [id number]  Take 220 mg by mouth 2 (two) times daily as needed (severe pain.). [provider]  Active Self  Potassium 99 MG TABS [id number]  Take 198 mg by mouth daily as needed (muscle cramping/pain.). [provider]  Active Self  vitamin C (ASCORBIC ACID) 250 MG tablet [id number]  Take 250 mg by mouth every evening. [provider]  Active Self            Recommendation:   Specialty provider follow-up with infusion clinic as directed for Remicade infusions  Follow Up Plan:   Telephone  follow up appointment date/time:  Tuesday, October 28 at 11:30 AM  Clayborne Ly RN BSN CCM [place]  [facility], [facility] Health Nurse Care Coordinator  Direct Dial: [phone] Website: [email or web address]

## 2023-12-06 ENCOUNTER — Ambulatory Visit

## 2023-12-17 ENCOUNTER — Telehealth: Payer: Self-pay

## 2024-01-06 ENCOUNTER — Other Ambulatory Visit: Payer: Self-pay

## 2024-01-06 NOTE — Patient Outreach (Addendum)
 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  I obtained verbal consent from Patient.  Visit completed with Patient on the phone.  Background:   Past Medical History:  Diagnosis Date   Allergy    seasonal   Chest pain 06/05/2011   CT chest 04/2011:  No infiltrate, no effusion, no PE EKG 2013:  No change to suggest pericarditis    Chest pain of uncertain etiology 06/05/2011   CT chest 04/2011:  No infiltrate, no effusion, no PE  EKG 2013:  No change to suggest pericarditis     Chronic bilateral low back pain without sciatica 05/23/2022   Crohn's disease (HCC)    followed by Dr. Rosabel Balder for this. visit from 10/12/2015 scanned into chart   High risk medication use 05/30/2022   HLD (hyperlipidemia) 12/17/2012   IUD (intrauterine device) in place 08/26/2013   paraguard 07/2013  Gyn Inocente Salt, NP     OSA (obstructive sleep apnea)    mild per sleep study.    Paroxysmal SVT (supraventricular tachycardia) 08/10/2013   Plantar wart of left foot 05/30/2022   Routine general medical examination at a health care facility 08/25/2015   Statin declined 04/14/2018    Assessment: Patient Reported Symptoms:  Cognitive Cognitive Status: Alert and oriented to person, place, and time, Normal speech and language skills Cognitive/Intellectual Conditions Management [RPT]: None reported or documented in medical history or problem list   Health Maintenance Behaviors: Annual physical exam, Healthy diet Health Facilitated by: Healthy diet, Rest  Neurological Neurological Review of Symptoms: Not assessed    HEENT HEENT Symptoms Reported: Not assessed      Cardiovascular Cardiovascular Symptoms Reported: Irregular pulse Does patient have  uncontrolled Hypertension?: No Cardiovascular Management Strategies: Adequate rest, Medication therapy, Routine screening Cardiovascular Self-Management Outcome: 4 (good)  Respiratory Respiratory Symptoms Reported: No symptoms reported    Endocrine Endocrine Symptoms Reported: No symptoms reported Is patient diabetic?: No    Gastrointestinal Gastrointestinal Symptoms Reported: Abdominal pain or discomfort, Diarrhea Additional Gastrointestinal Details: intermittent Crohn's flare; Patient is receiving Remicade infusions with good results Gastrointestinal Management Strategies: Adequate rest, Coping strategies, Medication therapy, Diet modification Gastrointestinal Self-Management Outcome: 4 (good)    Genitourinary Genitourinary Symptoms Reported: Not assessed    Integumentary Integumentary Symptoms Reported: Not assessed    Musculoskeletal Musculoskelatal Symptoms Reviewed: No symptoms reported        Psychosocial Psychosocial Symptoms Reported: No symptoms reported   Major Change/Loss/Stressor/Fears (CP): Denies Quality of Family Relationships: helpful, involved, supportive Do you feel physically threatened by others?: No    01/06/2024    PHQ2-9 Depression Screening   Abdulahad Mederos interest or pleasure in doing things    Feeling down, depressed, or hopeless    PHQ-2 - Total Score    Trouble falling or staying asleep, or sleeping too much    Feeling tired or having Jermario Kalmar energy    Poor appetite or overeating     Feeling bad about yourself - or that you are a failure or have let yourself or your family down    Trouble concentrating on things, such as reading the newspaper or watching television    Moving or speaking so slowly that other people could have noticed.  Or the opposite - being so fidgety or restless that you have been  moving around a lot more than usual    Thoughts that you would be better off dead, or hurting yourself in some way    PHQ2-9 Total Score    If you checked off  any problems, how difficult have these problems made it for you to do your work, take care of things at home, or get along with other people    Depression Interventions/Treatment      There were no vitals filed for this visit. Pain Scale: Not given for pain  Medications Reviewed Today     Reviewed by Morgan Clayborne CROME, RN (Registered Nurse) on 01/06/24 at 1210  Med List Status: <None>   Medication Order Taking? Sig Documenting Provider Last Dose Status Informant  acetaminophen  (TYLENOL ) 325 MG tablet [id number]  Take 325-650 mg by mouth every 6 (six) hours as needed (pain.). [provider]  Active Self  aspirin  EC 325 MG tablet [id number]  Take 650 mg by mouth 2 (two) times daily as needed (pain.). [provider]  Active Self  aspirin  EC 81 MG tablet [id number]  Take 1 tablet (81 mg total) by mouth daily. Swallow whole. Madireddy, Alean SAUNDERS, MD  Active Self  budesonide (ENTOCORT EC) 3 MG 24 hr capsule [id number]  Take 6 mg by mouth in the morning.  Patient taking differently: Take 6 mg by mouth as needed (crohns disease). Patient takes a titrated 9 day course during a flare as directed   [provider]  Active Self           Med Note MAPLE, RILEY C   Mon Aug 12, 2023 10:03 AM)    Cholecalciferol (VITAMIN D  PO) [id number]  Take 1 tablet by mouth every evening. [provider]  Active Self  dextromethorphan (DELSYM) 30 MG/5ML liquid [id number]  Take by mouth as needed for cough.  Patient not taking: Reported on 10/22/2023   [provider]  Active Self  estradiol  (ESTRACE ) 0.5 MG tablet [id number]  TAKE 1 TABLET BY MOUTH EVERY DAY Early, Sara E, NP  Active   ibuprofen  (ADVIL ) 200 MG tablet [id number]  Take 400 mg by mouth every 6 (six) hours as needed (pain.). [provider]  Active Self  loperamide (IMODIUM) 2 MG capsule [id number]  Take 2 mg by mouth 4 (four) times daily as needed for diarrhea or loose stools. [provider]  Active  Self  loratadine (CLARITIN) 10 MG tablet [id number]  Take 10 mg by mouth daily.  Patient taking differently: Take 10 mg by mouth daily as needed for allergies.   [provider]  Active Self  MAGNESIUM OXIDE PO [id number]  Take 100 mg by mouth every evening.  Patient taking differently: Take 100 mg by mouth at bedtime as needed (to help with sleep).   [provider]  Active Self  medroxyPROGESTERone  (PROVERA ) 2.5 MG tablet [id number]  TAKE 1 TABLET BY MOUTH EVERY DAY Early, Sara E, NP  Active   Menaquinone-7 (VITAMIN K2 PO) [id number]  Take 1 tablet by mouth daily with supper. [provider]  Active Self  metoprolol  tartrate (LOPRESSOR ) 25 MG tablet [id number]  Take 1 tablet (25 mg total) by mouth 2 (two) times daily.  Patient taking differently: Take 25 mg by mouth 2 (two) times daily. Patient is taking this medication when symptoms occur.   Madireddy, Alean SAUNDERS, MD  Active   Multiple Vitamin (MULTIVITAMIN WITH MINERALS) TABS tablet [id number]  Take 1 tablet by mouth daily with supper. Women's One Daily Multivitamin  by [facility] [provider]  Active Self  naproxen sodium (ALEVE) 220 MG tablet [id number]  Take 220 mg by mouth 2 (two) times daily as needed (severe pain.). [provider]  Active Self  Potassium 99 MG TABS [id number]  Take 198 mg by mouth daily as needed (muscle cramping/pain.). [provider]  Active Self  vitamin C (ASCORBIC ACID) 250 MG tablet [id number]  Take 250 mg by mouth every evening. [provider]  Active Self            Recommendation:   Continue your Remicade infusions as directed Specialty provider follow-up   02/17/2024 10:30 AM EST Office Visit [facility]  [address]  [place], [place] [address]  [phone]  Torrance Levels, MD     Follow Up Plan:   Telephone follow up appointment date/time:  Monday, February 24, 2024 at 12:00 PM  Clayborne Ly RN  BSN CCM [place]  [facility], [facility] Health Nurse Care Coordinator  Direct Dial: [phone] Website: [email or web address]

## 2024-01-06 NOTE — Patient Instructions (Signed)
 Visit Information  Thank you for taking time to visit with me today. Please don't hesitate to contact me if I can be of assistance to you before our next scheduled appointment.  Your next care management appointment is by telephone on Monday, February 24, 2024 at 12:00 PM  Please call the care guide team at [phone] if you need to cancel, schedule, or reschedule an appointment.   Please call 1-800-273-TALK (toll free, 24 hour hotline) if you are experiencing a Mental Health or Behavioral Health Crisis or need someone to talk to.  Clayborne Ly RN BSN CCM [place]  [facility], [facility] Health Nurse Care Coordinator  Direct Dial: [phone] Website: [email or web address]

## 2024-02-24 ENCOUNTER — Telehealth: Payer: Self-pay

## 2024-02-24 NOTE — Patient Instructions (Addendum)
 Devere JONETTA Applethwaite - I am sorry I was unable to reach you today for our scheduled appointment.  Your next care management appointment is by telephone on Monday, January 19 at 3:30 PM  Please call the care guide team at [phone] if you need to cancel, schedule, or reschedule an appointment.   Please call 1-800-273-TALK (toll free, 24 hour hotline) if you are experiencing a Mental Health or Behavioral Health Crisis or need someone to talk to.  Thank you,   Clayborne Ly RN BSN CCM   [facility], [facility] Health Nurse Care Coordinator  Direct Dial: [phone] Website: [email or web address]

## 2024-03-09 ENCOUNTER — Telehealth

## 2024-03-14 ENCOUNTER — Other Ambulatory Visit: Payer: Self-pay | Admitting: Nurse Practitioner

## 2024-03-14 DIAGNOSIS — N959 Unspecified menopausal and perimenopausal disorder: Secondary | ICD-10-CM

## 2024-03-18 ENCOUNTER — Other Ambulatory Visit: Payer: Self-pay

## 2024-03-18 NOTE — Patient Instructions (Signed)
 Visit Information  Thank you for taking time to visit with me today.    Please call 1-800-273-TALK (toll free, 24 hour hotline) if you are experiencing a Mental Health or Behavioral Health Crisis or need someone to talk to.  Clayborne Ly RN BSN CCM [place]  [facility], [facility] Health Nurse Care Coordinator  Direct Dial: [phone] Website: [email or web address]

## 2024-03-18 NOTE — Patient Outreach (Signed)
 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  I obtained verbal consent from Patient.  Visit completed with Patient on the phone.  Background:   Past Medical History:  Diagnosis Date   Allergy    seasonal   Chest pain 06/05/2011   CT chest 04/2011:  No infiltrate, no effusion, no PE EKG 2013:  No change to suggest pericarditis    Chest pain of uncertain etiology 06/05/2011   CT chest 04/2011:  No infiltrate, no effusion, no PE  EKG 2013:  No change to suggest pericarditis     Chronic bilateral low back pain without sciatica 05/23/2022   Crohn's disease (HCC)    followed by Dr. Rosabel Balder for this. visit from 10/12/2015 scanned into chart   High risk medication use 05/30/2022   HLD (hyperlipidemia) 12/17/2012   IUD (intrauterine device) in place 08/26/2013   paraguard 07/2013  Gyn Inocente Salt, NP     OSA (obstructive sleep apnea)    mild per sleep study.    Paroxysmal SVT (supraventricular tachycardia) 08/10/2013   Plantar wart of left foot 05/30/2022   Routine general medical examination at a health care facility 08/25/2015   Statin declined 04/14/2018    Assessment: Patient Reported Symptoms:  Cognitive Cognitive Status: Alert and oriented to person, place, and time, Normal speech and language skills Cognitive/Intellectual Conditions Management [RPT]: None reported or documented in medical history or problem list      Neurological Neurological Review of Symptoms: No symptoms reported    HEENT HEENT Symptoms Reported: No symptoms reported      Cardiovascular Cardiovascular Symptoms Reported: No symptoms reported    Respiratory Respiratory Symptoms Reported: No symptoms reported    Endocrine Endocrine Symptoms  Reported: No symptoms reported Is patient diabetic?: No    Gastrointestinal Gastrointestinal Symptoms Reported: Other Other Gastrointestinal Symptoms: breakthrough symptoms r/t Crohn's disease have improved, continues to receive Remicade Infusions every 8 weeks Gastrointestinal Management Strategies: Medication therapy, Diet modification Gastrointestinal Self-Management Outcome: 4 (good)    Genitourinary Genitourinary Symptoms Reported: No symptoms reported    Integumentary Integumentary Symptoms Reported: No symptoms reported    Musculoskeletal Musculoskelatal Symptoms Reviewed: No symptoms reported        Psychosocial Psychosocial Symptoms Reported: No symptoms reported   Major Change/Loss/Stressor/Fears (CP): Denies Quality of Family Relationships: helpful, involved, supportive Do you feel physically threatened by others?: No    03/18/2024    PHQ2-9 Depression Screening   Sully Dyment interest or pleasure in doing things    Feeling down, depressed, or hopeless    PHQ-2 - Total Score    Trouble falling or staying asleep, or sleeping too much    Feeling tired or having Maddoxx Burkitt energy    Poor appetite or overeating     Feeling bad about yourself - or that you are a failure or have let yourself or your family down    Trouble concentrating on things, such as reading the newspaper or watching television    Moving or speaking so slowly that other people could have noticed.  Or the opposite - being so fidgety or restless that you have been moving around a lot more than usual    Thoughts that you would be better off dead, or hurting yourself in some way    PHQ2-9 Total  Score    If you checked off any problems, how difficult have these problems made it for you to do your work, take care of things at home, or get along with other people    Depression Interventions/Treatment      There were no vitals filed for this visit. Pain Scale: Not given for pain  Medications Reviewed Today      Reviewed by Morgan Clayborne CROME, RN (Registered Nurse) on 03/18/24 at [phone]  Med List Status: <None>   Medication Order Taking? Sig Documenting Provider Last Dose Status Informant  acetaminophen  (TYLENOL ) 325 MG tablet [id number]  Take 325-650 mg by mouth every 6 (six) hours as needed (pain.). [provider]  Active Self  aspirin  EC 325 MG tablet [id number]  Take 650 mg by mouth 2 (two) times daily as needed (pain.). [provider]  Active Self  aspirin  EC 81 MG tablet [id number]  Take 1 tablet (81 mg total) by mouth daily. Swallow whole. Madireddy, Alean SAUNDERS, MD  Active Self  budesonide (ENTOCORT EC) 3 MG 24 hr capsule [id number]  Take 6 mg by mouth in the morning.  Patient taking differently: Take 6 mg by mouth as needed (crohns disease). Patient takes a titrated 9 day course during a flare as directed   [provider]  Active Self           Med Note MAPLE, RILEY C   Mon Aug 12, 2023 10:03 AM)    Cholecalciferol (VITAMIN D  PO) [id number]  Take 1 tablet by mouth every evening. [provider]  Active Self  dextromethorphan (DELSYM) 30 MG/5ML liquid [id number]  Take by mouth as needed for cough.  Patient not taking: Reported on 10/22/2023   [provider]  Active Self  estradiol  (ESTRACE ) 0.5 MG tablet [id number]  TAKE 1 TABLET BY MOUTH EVERY DAY Early, Sara E, NP  Active   ibuprofen  (ADVIL ) 200 MG tablet [id number]  Take 400 mg by mouth every 6 (six) hours as needed (pain.). [provider]  Active Self  loperamide (IMODIUM) 2 MG capsule [id number]  Take 2 mg by mouth 4 (four) times daily as needed for diarrhea or loose stools. [provider]  Active Self  loratadine (CLARITIN) 10 MG tablet [id number]  Take 10 mg by mouth daily.  Patient taking differently: Take 10 mg by mouth daily as needed for allergies.   [provider]  Active Self  MAGNESIUM OXIDE PO [id number]  Take 100 mg by mouth every evening.  Patient taking differently:  Take 100 mg by mouth at bedtime as needed (to help with sleep).   [provider]  Active Self  medroxyPROGESTERone  (PROVERA ) 2.5 MG tablet [id number]  TAKE 1 TABLET BY MOUTH EVERY DAY Early, Sara E, NP  Active   Menaquinone-7 (VITAMIN K2 PO) [id number]  Take 1 tablet by mouth daily with supper. [provider]  Active Self  metoprolol  tartrate (LOPRESSOR ) 25 MG tablet [id number]  Take 1 tablet (25 mg total) by mouth 2 (two) times daily.  Patient taking differently: Take 25 mg by mouth 2 (two) times daily. Patient is taking this medication when symptoms occur.   MadireddyAlean SAUNDERS, MD  Expired 02/10/24 2359   Multiple Vitamin (MULTIVITAMIN WITH MINERALS) TABS tablet [id number]  Take 1 tablet by mouth daily with supper. Women's One Daily Multivitamin by [employer] [provider]  Active Self  naproxen sodium (ALEVE) 220 MG tablet [id number]  Take 220 mg by mouth 2 (two) times daily as  needed (severe pain.). [provider]  Active Self  Potassium 99 MG TABS [id number]  Take 198 mg by mouth daily as needed (muscle cramping/pain.). [provider]  Active Self  vitamin C (ASCORBIC ACID) 250 MG tablet [id number]  Take 250 mg by mouth every evening. [provider]  Active Self            Recommendation:   Continue Current Plan of Care  Follow Up Plan:   Closing From:  Complex Care Management  Clayborne Ly RN BSN CCM [facility]  [facility], [facility] Nurse Care Coordinator  Direct Dial: [phone] Website: [email or web address]

## 2024-03-19 ENCOUNTER — Encounter: Payer: Self-pay | Admitting: Internal Medicine

## 2024-04-14 ENCOUNTER — Encounter: Payer: Self-pay | Admitting: Nurse Practitioner
